# Patient Record
Sex: Female | Born: 1970 | Race: Black or African American | Hispanic: No | Smoking: Never smoker
Health system: Southern US, Community
[De-identification: ages and names within clinical notes are randomized; demographics above are authoritative.]

## PROBLEM LIST (undated history)

## (undated) DIAGNOSIS — N970 Female infertility associated with anovulation: Secondary | ICD-10-CM

## (undated) HISTORY — DX: Female infertility associated with anovulation: N97.0

---

## 2001-05-29 ENCOUNTER — Other Ambulatory Visit: Admission: RE | Admit: 2001-05-29 | Discharge: 2001-05-29 | Payer: Self-pay | Admitting: Obstetrics and Gynecology

## 2001-10-02 ENCOUNTER — Other Ambulatory Visit: Admission: RE | Admit: 2001-10-02 | Discharge: 2001-10-02 | Payer: Self-pay | Admitting: Gynecology

## 2002-05-22 HISTORY — PX: DIAGNOSTIC LAPAROSCOPY: SUR761

## 2002-12-11 ENCOUNTER — Other Ambulatory Visit: Admission: RE | Admit: 2002-12-11 | Discharge: 2002-12-11 | Payer: Self-pay | Admitting: Gynecology

## 2003-09-20 ENCOUNTER — Emergency Department (HOSPITAL_COMMUNITY): Admission: EM | Admit: 2003-09-20 | Discharge: 2003-09-20 | Payer: Self-pay | Admitting: Emergency Medicine

## 2003-12-03 ENCOUNTER — Other Ambulatory Visit: Admission: RE | Admit: 2003-12-03 | Discharge: 2003-12-03 | Payer: Self-pay | Admitting: Gynecology

## 2005-12-21 ENCOUNTER — Inpatient Hospital Stay (HOSPITAL_COMMUNITY): Admission: AD | Admit: 2005-12-21 | Discharge: 2005-12-21 | Payer: Self-pay | Admitting: Gynecology

## 2006-12-13 ENCOUNTER — Other Ambulatory Visit: Admission: RE | Admit: 2006-12-13 | Discharge: 2006-12-13 | Payer: Self-pay | Admitting: Gynecology

## 2007-08-06 ENCOUNTER — Ambulatory Visit (HOSPITAL_COMMUNITY): Admission: RE | Admit: 2007-08-06 | Discharge: 2007-08-06 | Payer: Self-pay | Admitting: Gynecology

## 2007-12-25 ENCOUNTER — Inpatient Hospital Stay (HOSPITAL_COMMUNITY): Admission: AD | Admit: 2007-12-25 | Discharge: 2007-12-25 | Payer: Self-pay | Admitting: Obstetrics and Gynecology

## 2007-12-28 ENCOUNTER — Observation Stay (HOSPITAL_COMMUNITY): Admission: AD | Admit: 2007-12-28 | Discharge: 2007-12-29 | Payer: Self-pay | Admitting: Obstetrics and Gynecology

## 2008-01-05 ENCOUNTER — Inpatient Hospital Stay (HOSPITAL_COMMUNITY): Admission: AD | Admit: 2008-01-05 | Discharge: 2008-01-06 | Payer: Self-pay | Admitting: Obstetrics and Gynecology

## 2008-01-07 ENCOUNTER — Encounter: Payer: Self-pay | Admitting: Obstetrics and Gynecology

## 2008-01-07 ENCOUNTER — Inpatient Hospital Stay (HOSPITAL_COMMUNITY): Admission: AD | Admit: 2008-01-07 | Discharge: 2008-01-07 | Payer: Self-pay | Admitting: Obstetrics and Gynecology

## 2008-01-11 ENCOUNTER — Inpatient Hospital Stay (HOSPITAL_COMMUNITY): Admission: AD | Admit: 2008-01-11 | Discharge: 2008-01-11 | Payer: Self-pay | Admitting: Obstetrics and Gynecology

## 2008-01-21 ENCOUNTER — Ambulatory Visit (HOSPITAL_COMMUNITY): Admission: RE | Admit: 2008-01-21 | Discharge: 2008-01-21 | Payer: Self-pay | Admitting: Obstetrics and Gynecology

## 2008-05-27 ENCOUNTER — Observation Stay (HOSPITAL_COMMUNITY): Admission: AD | Admit: 2008-05-27 | Discharge: 2008-05-27 | Payer: Self-pay | Admitting: Obstetrics and Gynecology

## 2008-06-16 ENCOUNTER — Inpatient Hospital Stay (HOSPITAL_COMMUNITY): Admission: RE | Admit: 2008-06-16 | Discharge: 2008-06-19 | Payer: Self-pay | Admitting: Obstetrics and Gynecology

## 2008-06-30 ENCOUNTER — Ambulatory Visit: Admission: RE | Admit: 2008-06-30 | Discharge: 2008-06-30 | Payer: Self-pay | Admitting: Obstetrics and Gynecology

## 2009-05-22 HISTORY — PX: DILATATION & CURRETTAGE/HYSTEROSCOPY WITH RESECTOCOPE: SHX5572

## 2009-06-04 IMAGING — US US OB DETAIL+14 WK
1 series · 14 of 28 positions shown · non-contrast
Comparison: none

OBSTETRICAL ULTRASOUND:
 This ultrasound was performed in The [HOSPITAL], and the [facility] report will be stored to [REDACTED] PACS.

[Series 1: us ob detail+14 wk · 14 of 120 slices shown]
[im 5/120]
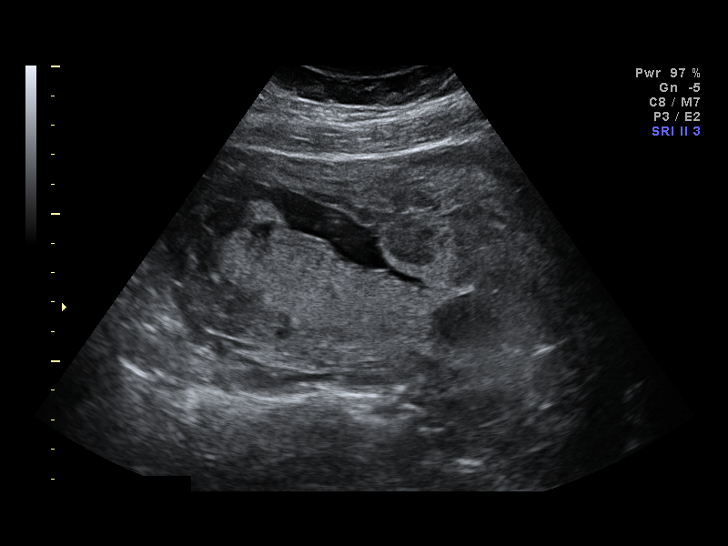
[im 14/120]
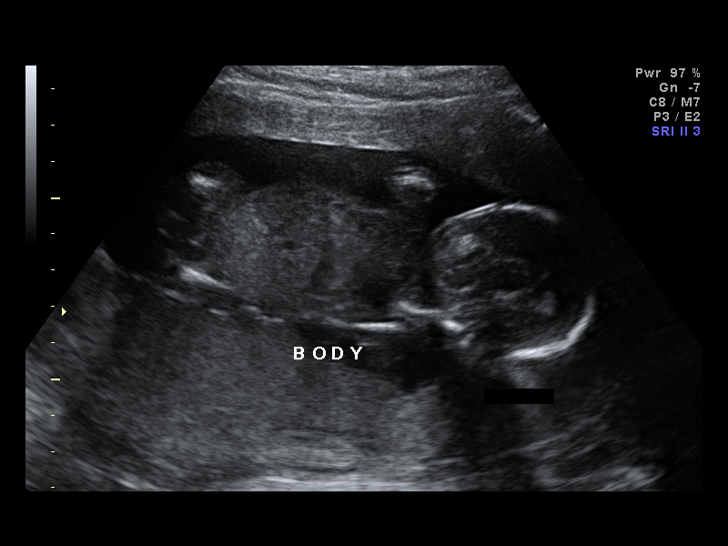
[im 23/120]
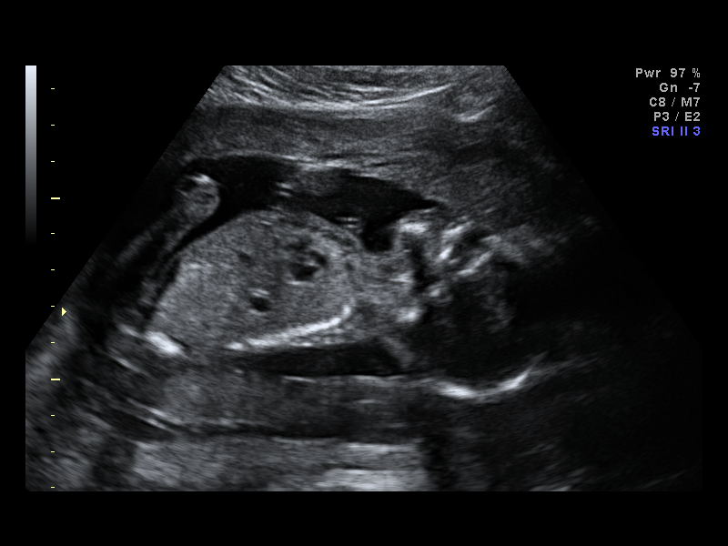
[im 31/120]
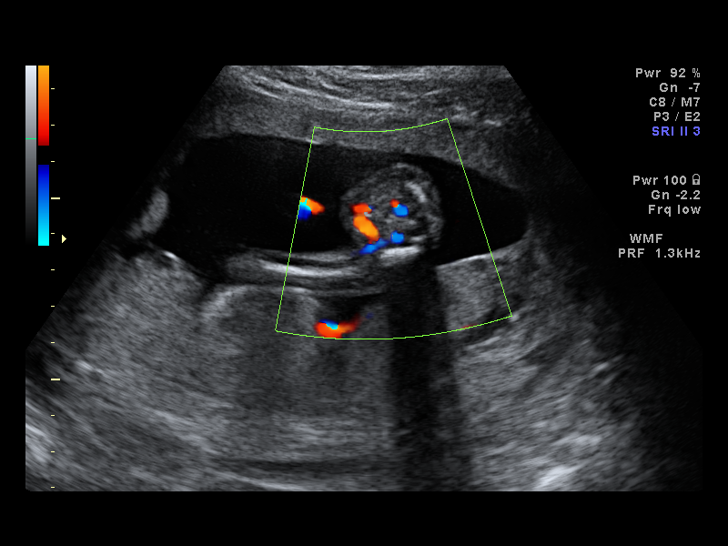
[im 40/120]
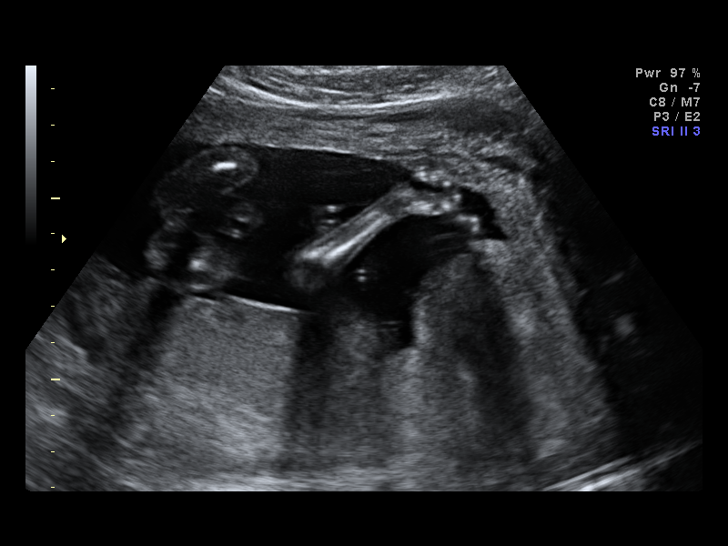
[im 49/120]
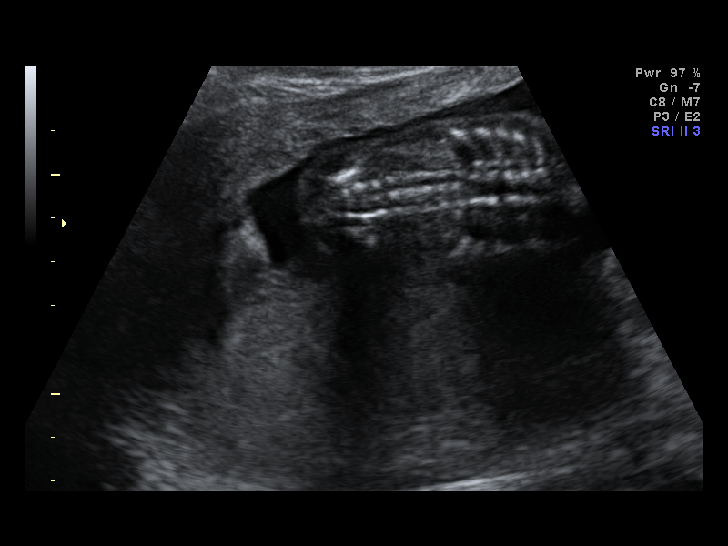
[im 58/120]
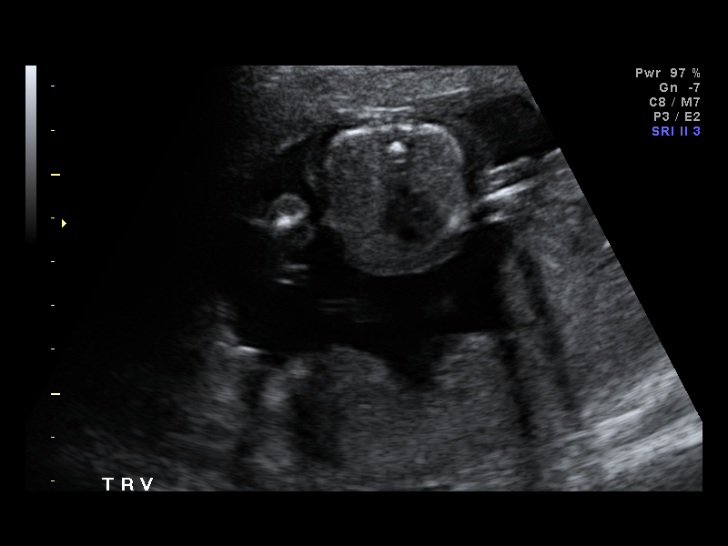
[im 67/120]
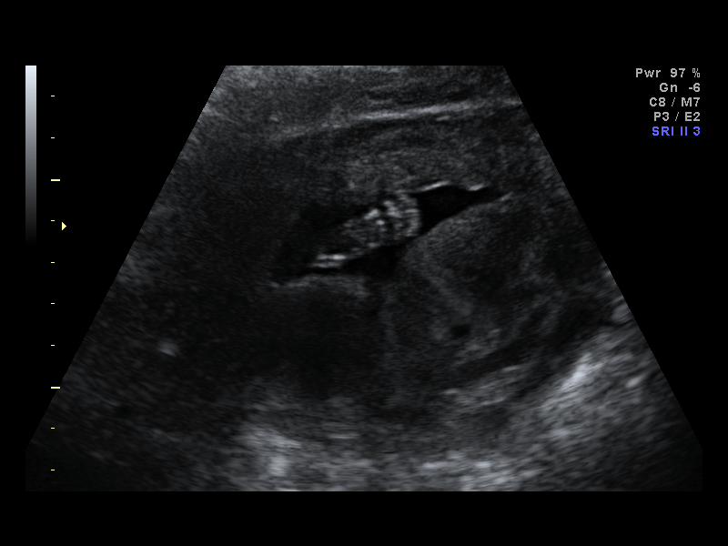
[im 75/120]
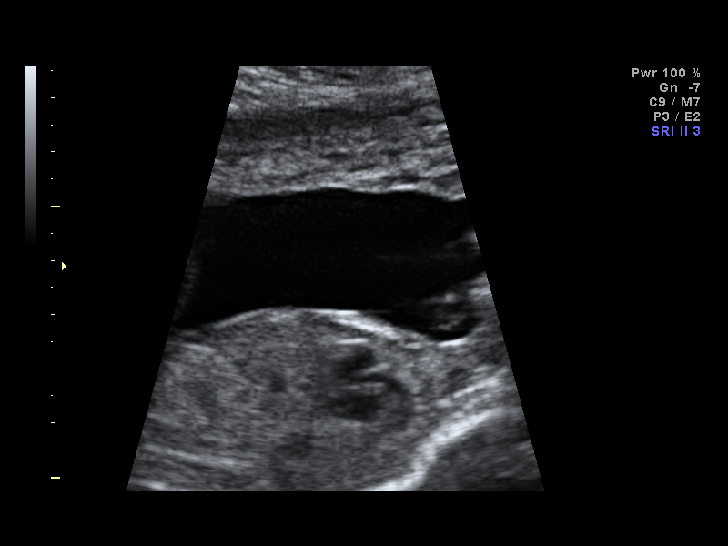
[im 84/120]
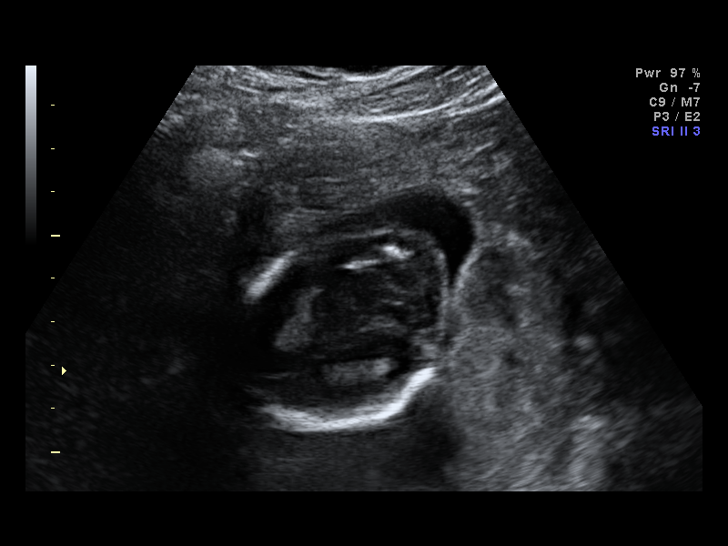
[im 93/120]
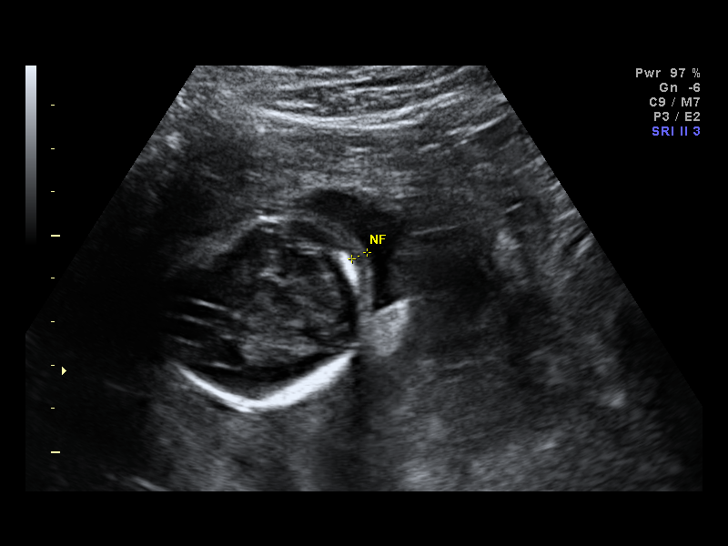
[im 102/120]
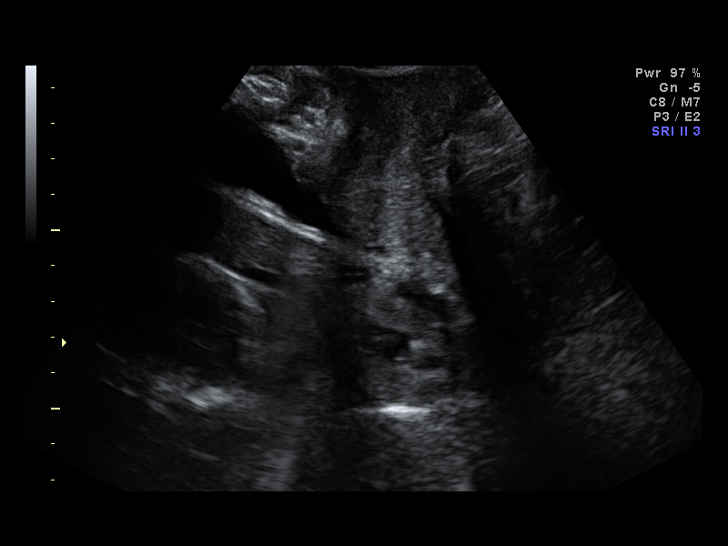
[im 111/120]
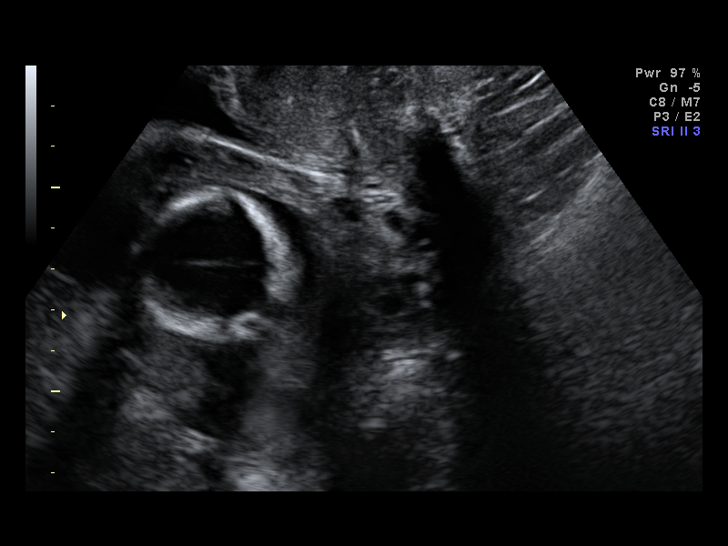
[im 120/120]
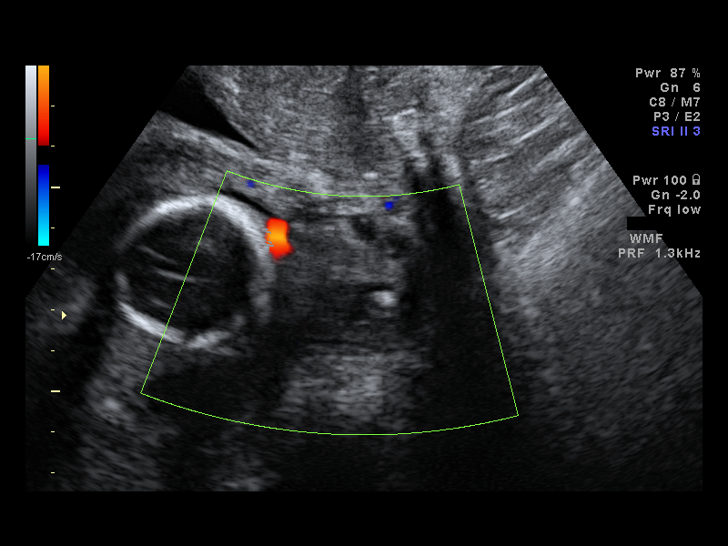

[14 of 28 positions shown; findings below may reference images not displayed]

IMPRESSION: [facility] has also been faxed to the ordering physician.

## 2010-01-22 ENCOUNTER — Ambulatory Visit (HOSPITAL_COMMUNITY): Admission: RE | Admit: 2010-01-22 | Discharge: 2010-01-22 | Payer: Self-pay | Admitting: Obstetrics and Gynecology

## 2010-08-04 LAB — CBC
HCT: 41.3 % (ref 36.0–46.0)
MCH: 31.5 pg (ref 26.0–34.0)
MCHC: 34.4 g/dL (ref 30.0–36.0)
RBC: 4.52 MIL/uL (ref 3.87–5.11)
RDW: 13.7 % (ref 11.5–15.5)

## 2010-09-05 LAB — CBC
HCT: 29.2 % — ABNORMAL LOW (ref 36.0–46.0)
Hemoglobin: 9.5 g/dL — ABNORMAL LOW (ref 12.0–15.0)
MCHC: 32.7 g/dL (ref 30.0–36.0)
MCV: 79.7 fL (ref 78.0–100.0)
MCV: 80.5 fL (ref 78.0–100.0)
Platelets: 173 10*3/uL (ref 150–400)
RBC: 3.66 MIL/uL — ABNORMAL LOW (ref 3.87–5.11)
RDW: 18.3 % — ABNORMAL HIGH (ref 11.5–15.5)
RDW: 18.8 % — ABNORMAL HIGH (ref 11.5–15.5)
WBC: 11 10*3/uL — ABNORMAL HIGH (ref 4.0–10.5)

## 2010-09-05 LAB — STREP B DNA PROBE

## 2010-09-05 LAB — RPR: RPR Ser Ql: NONREACTIVE

## 2010-10-04 NOTE — H&P (Signed)
NAMEBLEN, RANSOME NO.:  [id number]   MEDICAL RECORD NO.:  [id number]          PATIENT TYPE:  INP   LOCATION:  9173                          FACILITY:  [facility]   PHYSICIAN:  Osborn Coho, M.D.   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  She reports irregular uterine contractions and positive fetal  movement.  Pregnancy has been remarkable for;  1. Advanced maternal age with genetic screening declined.  2. History of infertility.  3. Cervical cerclage placed at 14 weeks secondary to cervical change      with it removed on May 27, 2008, at 36 weeks.  4. History of short cycles.  5. First trimester bleeding.  6. Family history of Down's syndrome.   PRENATAL LABS:  Blood type is A+, Rh antibody negative.  VDRL  nonreactive, rubella titer positive, hepatitis B surface antigen  negative, HIV is nonreactive.  Varicella titer was immune.  TSH was  normal.  Sickle cell test was negative.  GC and Chlamydia cultures were  negative in the first trimester.  Pap was normal.  PPD was negative.  Group B strep culture was negative at 36 weeks.  The patient declined  all genetic testing.  She had a normal Glucola.  Hemoglobin upon  entering the practice was 13.2, it was within normal limits at 28 weeks.   HISTORY OF PRESENT PREGNANCY:  The patient entered care at 11 weeks.  She had, had an ultrasound in July at 10 weeks showing an [facility] of June 19, 2008.  This was also congruent with a 5-week ultrasound.  She did  have a posterior placental hemorrhage on inferior margin and an anterior  subchorionic hemorrhage.  The patient declined all genetic testing.  At  14 weeks, she was seen in maternity admissions unit with bleeding.  Ultrasound then was normal.  At that time, cervical length was noted to  be normal on exam.  However, subsequent to that, she was seen in  maternity admissions again and had a cerclage placed for cervical  thinning.  Hemoglobin at that time was 10.  She had cervical Dopplers  done at maternal fetal medicine secondary to increased cervical  vascularity.  She was placed on 17P weekly.  She had another ultrasound  at 18 weeks showing cervical length of 4.56 and normal anatomy.  At that  time, she was placed on bedrest.  She had one episode of tachycardia at  19 weeks.  She had a TSH done that was normal and referral to her  cardiologist.  She was placed on Terazol at 22 weeks.  She had another  ultrasound at 23-6/7 weeks showing normal cervical length and normal  growth.  At 26 weeks, cervical length was 3.9 cm, normal fluid was  noted.  An estimated fetal weight was at the 75th percentile.  She had a  normal Glucola at 27 weeks.  Her cervical length at that time was 4.5 cm  and she wore a Holter monitor for cardiology evaluation.  No significant  findings were noted.  At 30 weeks, she had an episode of elevated blood  pressure.  She had a normal cervical length at that time and normal  growth.  She had no further issues with her blood pressure after that.  At 32 weeks, her cervical length was 3.46 and growth was at the 57th  percentile.  She had negative group B strep noted at 36 weeks.  She  received 17P at 34 weeks.  Cervical cerclage removed on May 27, 2008, without difficulty.  Cervix at that time was closed, but this week  her cervix was noted to be 2 cm.   OBSTETRICAL HISTORY:  In 1992, she had a termination of pregnancy in the  first trimester.  In 2007, she had a first trimester loss which was an  SAB versus an ectopic.  She was treated with methotrexate without  complication.   MEDICAL HISTORY:  She was on oral contraceptives in the past.  She was  followed by Dr. Elesa Hacker  prior to this pregnancy. Usual childhood  illnesses.  She has had one history of a UTI.   SURGICAL HISTORY:  She had a TAB in 1992 and a diagnostic laparoscopy in  2004.   ALLERGIES:  NO KNOWN MEDICATION ALLERGIES.   FAMILY HISTORY:  Her father has diabetes.   GENETIC HISTORY:  Remarkable for the patient being age 36 and the father  of baby being 42, and the patient having a niece with Down's syndrome.   SOCIAL HISTORY:  The patient is married to the father of baby.  He is  involved and supportive.  His name is Shelvy Heckert.  The patient is  [place] of the [place] faith.  She is an OB/GYN physician.  Her husband is graduate educated, he is a Runner, broadcasting/film/video.  She has been  followed by the Physician Service at [facility].  She denies  any alcohol, drug or tobacco use during this pregnancy.   PHYSICAL EXAMINATION:  VITAL SIGNS:  Stable.  The patient is afebrile.  HEENT:  Within normal limits.  LUNGS:  Breath sounds are clear.  HEART:  Regular rate and rhythm without murmur.  BREASTS:  Soft and nontender.  ABDOMEN:  Fundal height is approximately 39 cm.  Estimated fetal weight  7 to 7-1/2 pounds.  Uterine contractions are very occasionally mild.  Fetal heart rate is reactive.  Cervical exam is 2, 50%, vertex from the  office exam this past week.  EXTREMITIES:  Deep tendon reflexes are 2+ without clonus.  There is a  trace edema noted.   IMPRESSION:  1. Intrauterine pregnancy at 39-3/7 weeks.  2. Previous cervical cerclage, recently moved  3. Group B strep negative.   PLAN:  1. Admit to birthing suite and consult Dr. Su Hilt as attending      physician.  2. Routine physician orders.  3. Dr. Su Hilt plans initiation of Pitocin for labor induction.  4. Pain med p.r.n.      Renaldo Reel Emilee Hero, C.N.M.      Osborn Coho, M.D.  Electronically Signed    VLL/MEDQ  D:  06/16/2008  T:  06/16/2008  Job:  [id number]

## 2010-10-04 NOTE — Op Note (Signed)
NAMEJOANNE, SALAH NO.:  [id number]   MEDICAL RECORD NO.:  [id number]          PATIENT TYPE:  OBV   LOCATION:  9303                          FACILITY:  [facility]   PHYSICIAN:  Osborn Coho, M.D.   DATE OF BIRTH:  [birth year]   DATE OF PROCEDURE:  12/28/2007  DATE OF DISCHARGE:  12/29/2007                               OPERATIVE REPORT   PREOPERATIVE DIAGNOSES:  1. Vaginal bleeding.  2. Incompetent cervix.  3. 15 weeks' pregnant.   POSTOPERATIVE DIAGNOSES:  1. Vaginal bleeding.  2. Incompetent cervix.  3. 15 weeks' pregnant.   PROCEDURE:  Cerclage.   ATTENDING:  Osborn Coho, M.D.   ASSISTANT:  French Ana, C.N.M.   ANESTHESIA:  Spinal.   FINDINGS:  Cervix dilated approximately 1 to 2 cm and primary source of  bleeding from an artery at 12 o'clock on the anterior lip of the cervix.  Ultrasound in PACU immediately postop showed a positive fetal heart rate  at 152 and subjectively normal amniotic fluid.   FLUIDS:  1800 mL.   URINE OUTPUT:  Quite sufficient via straight cath prior to procedure.   ESTIMATED BLOOD LOSS:  Mostly occurred in the MAU, 1000 mL.   COMPLICATIONS:  None.   INDICATION:  A 15 weeks' pregnant with profuse vaginal bleeding that was  unable to be controlled in the MAU.  However, a clamp was placed on the  cervix where most of the bleeding was occurring in the MAU, and the  patient was taken to the OR room 3 for procedure.   PROCEDURE:  The patient was taken to the operating room after risks,  benefits, and alternatives discussed with the patient.  The patient  verbalized understanding and consent signed and witnessed.  The patient  was given a spinal per anesthesia and prepped and draped in the normal  sterile fashion in the dorsal lithotomy position.  A weighted speculum  was placed in the patient's vagina and a Deaver placed in the anterior  vaginal wall for retraction.  The cervix was prepped thoroughly again  with  Betadine and the cerclage placed with #1 Mersilene and the suture  tied at the 12 o'clock position.  This was done while the clamp on the  primary source of bleeding was left in place.  The oozing that had been  noted generally from the cervix at this point had ceased.  The clamp was  removed and again profuse vaginal bleeding from that vessel was noted.  The decision was made to place a stitch at this location of #1 Prolene  to be removed at the time that the cerclage was removed.  Good  hemostasis at this point was noted.  The vagina was copiously irrigated  and a clindamycin douche was administered.  Hemostasis persisted.  All  instruments were removed.  The patient tolerated the procedure well.  Sponge, lap, and needle count was correct, and the patient was returned  to the recovery room in good condition.  While in the recovery room as  mentioned earlier, an ultrasound by Radiology was performed and noted a  fetal heart rate of 152 and subjectively normal fluid.      Osborn Coho, M.D.  Electronically Signed     AR/MEDQ  D:  12/29/2007  T:  12/29/2007  Job:  [id number]

## 2011-02-17 LAB — CBC
HCT: 36.4
Hemoglobin: 8.4 — ABNORMAL LOW
MCHC: 33.6
MCHC: 33.8
MCV: 94.3
Platelets: 159
Platelets: 212
RBC: 3.86 — ABNORMAL LOW
RDW: 14.3
RDW: 14.4

## 2011-02-17 LAB — CROSSMATCH: ABO/RH(D): A POS

## 2012-04-01 ENCOUNTER — Ambulatory Visit (INDEPENDENT_AMBULATORY_CARE_PROVIDER_SITE_OTHER): Payer: PRIVATE HEALTH INSURANCE | Admitting: Obstetrics and Gynecology

## 2012-04-01 ENCOUNTER — Encounter: Payer: Self-pay | Admitting: Obstetrics and Gynecology

## 2012-04-01 VITALS — BP 104/80 | Ht 67.0 in | Wt 226.0 lb

## 2012-04-01 DIAGNOSIS — Z139 Encounter for screening, unspecified: Secondary | ICD-10-CM

## 2012-04-01 DIAGNOSIS — N920 Excessive and frequent menstruation with regular cycle: Secondary | ICD-10-CM

## 2012-04-01 DIAGNOSIS — Z01419 Encounter for gynecological examination (general) (routine) without abnormal findings: Secondary | ICD-10-CM

## 2012-04-01 DIAGNOSIS — Z124 Encounter for screening for malignant neoplasm of cervix: Secondary | ICD-10-CM

## 2012-04-01 DIAGNOSIS — N898 Other specified noninflammatory disorders of vagina: Secondary | ICD-10-CM

## 2012-04-01 LAB — CBC
MCV: 87.9 fL (ref 78.0–100.0)
Platelets: 254 10*3/uL (ref 150–400)
RBC: 4.46 MIL/uL (ref 3.87–5.11)
RDW: 14 % (ref 11.5–15.5)
WBC: 10.5 10*3/uL (ref 4.0–10.5)

## 2012-04-01 NOTE — Progress Notes (Addendum)
Contraception none Last pap ? Last Mammo 09/2011 to F/U in Dec Last Colonoscopy None Last Dexa Scan None Primary MD Dorothyann Peng Abuse at Home None  C/o internal vag itch and frequent menses  Filed Vitals:   04/01/12 1711  BP: 104/80   ROS: noncontributory  Physical Examination: General appearance - alert, well appearing, and in no distress Neck - supple, no significant adenopathy Chest - clear to auscultation, no wheezes, rales or rhonchi, symmetric air entry Heart - normal rate and regular rhythm Abdomen - soft, nontender, nondistended, no masses or organomegaly Breasts - breasts appear normal, no suspicious masses, no skin or nipple changes or axillary nodes Pelvic - normal external genitalia, vulva, vagina, uterus and adnexa, cervix has ?nabothian cyst on ant lip palpable approx 1/2cm Back exam - no CVAT Extremities - no edema, redness or tenderness in the calves or thighs  A/P Pap today Std testing with consent Check cbc and vit d Wet prep - neg

## 2012-04-02 LAB — VITAMIN D 25 HYDROXY (VIT D DEFICIENCY, FRACTURES): Vit D, 25-Hydroxy: 17 ng/mL — ABNORMAL LOW (ref 30–89)

## 2012-04-03 ENCOUNTER — Other Ambulatory Visit: Payer: Self-pay | Admitting: Obstetrics and Gynecology

## 2012-04-03 ENCOUNTER — Telehealth: Payer: Self-pay

## 2012-04-03 ENCOUNTER — Other Ambulatory Visit: Payer: Self-pay

## 2012-04-03 DIAGNOSIS — E559 Vitamin D deficiency, unspecified: Secondary | ICD-10-CM

## 2012-04-03 LAB — PAP IG, CT-NG, RFX HPV ASCU: Chlamydia Probe Amp: NEGATIVE

## 2012-04-03 MED ORDER — VITAMIN D3 1.25 MG (50000 UT) PO CAPS: 1.0000 | ORAL_CAPSULE | ORAL | Status: DC

## 2012-04-03 NOTE — Telephone Encounter (Signed)
Per protocol, Vit D softgels called to [facility] and [place]. Vit D softgels, 50,000 units 1 po 2x weekly x 8 weeks # 16 0 RF. Future order and recall entered. [name]

## 2012-04-03 NOTE — Telephone Encounter (Signed)
Note to close encounter. Martha [name], Jacqueline A  

## 2012-06-04 ENCOUNTER — Ambulatory Visit: Payer: PRIVATE HEALTH INSURANCE | Admitting: Obstetrics and Gynecology

## 2012-06-04 ENCOUNTER — Encounter: Payer: Self-pay | Admitting: Obstetrics and Gynecology

## 2012-06-04 DIAGNOSIS — B029 Zoster without complications: Secondary | ICD-10-CM

## 2012-06-04 MED ORDER — VALACYCLOVIR HCL 1 G PO TABS: ORAL_TABLET | ORAL | Status: DC

## 2012-06-04 NOTE — Progress Notes (Signed)
41 YO complains of a one day history of painful rash on left buttock that is increasing in intensity.  Denies any recall of any bite to the area and admits to a history of chickenpox.  O: Buttock:  left natal cleft with a cluster of vesicular vs pustular lesions on an erythematous base, no drainage  A: ? Shingles  P: HSZ culture pending      Valtrex 1 gm tid x 7 days prescribed    Savon Cobbs, PA-C

## 2012-08-01 ENCOUNTER — Other Ambulatory Visit: Payer: Self-pay | Admitting: Obstetrics and Gynecology

## 2012-08-01 MED ORDER — TRANEXAMIC ACID 650 MG PO TABS: 1300.0000 mg | ORAL_TABLET | Freq: Three times a day (TID) | ORAL | Status: DC | PRN

## 2014-03-23 ENCOUNTER — Encounter: Payer: Self-pay | Admitting: Obstetrics and Gynecology

## 2017-02-27 DIAGNOSIS — R635 Abnormal weight gain: Secondary | ICD-10-CM | POA: Diagnosis not present

## 2017-02-27 DIAGNOSIS — R948 Abnormal results of function studies of other organs and systems: Secondary | ICD-10-CM | POA: Diagnosis not present

## 2017-02-27 DIAGNOSIS — E669 Obesity, unspecified: Secondary | ICD-10-CM | POA: Diagnosis not present

## 2017-02-27 DIAGNOSIS — Z6834 Body mass index (BMI) 34.0-34.9, adult: Secondary | ICD-10-CM | POA: Diagnosis not present

## 2017-02-27 DIAGNOSIS — E786 Lipoprotein deficiency: Secondary | ICD-10-CM | POA: Diagnosis not present

## 2017-02-27 DIAGNOSIS — R7303 Prediabetes: Secondary | ICD-10-CM | POA: Diagnosis not present

## 2017-12-11 ENCOUNTER — Other Ambulatory Visit: Payer: Self-pay | Admitting: Obstetrics and Gynecology

## 2017-12-11 DIAGNOSIS — N643 Galactorrhea not associated with childbirth: Secondary | ICD-10-CM

## 2018-01-18 ENCOUNTER — Other Ambulatory Visit: Payer: Self-pay

## 2018-03-18 ENCOUNTER — Ambulatory Visit (INDEPENDENT_AMBULATORY_CARE_PROVIDER_SITE_OTHER): Payer: Managed Care, Other (non HMO) | Admitting: Nurse Practitioner

## 2018-03-18 ENCOUNTER — Encounter: Payer: Self-pay | Admitting: Nurse Practitioner

## 2018-03-18 VITALS — BP 132/88 | HR 73 | Temp 98.1°F | Ht 67.0 in | Wt 216.4 lb

## 2018-03-18 DIAGNOSIS — E6609 Other obesity due to excess calories: Secondary | ICD-10-CM | POA: Diagnosis not present

## 2018-03-18 DIAGNOSIS — Z6833 Body mass index (BMI) 33.0-33.9, adult: Secondary | ICD-10-CM | POA: Diagnosis not present

## 2018-03-18 MED ORDER — PHENTERMINE HCL 37.5 MG PO CAPS: 37.5000 mg | ORAL_CAPSULE | ORAL | 1 refills | Status: DC

## 2018-03-18 NOTE — Patient Instructions (Signed)
Obesity, Adult Obesity is having too much body fat. If you have a BMI of 30 or more, you are obese. BMI is a number that explains how much body fat you have. Obesity is often caused by taking in (consuming) more calories than your body uses. Obesity can cause serious health problems. Changing your lifestyle can help to treat obesity. Follow these instructions at home: Eating and drinking   Follow advice from your doctor about what to eat and drink. Your doctor may tell you to: ? Cut down on (limit) fast foods, sweets, and processed snack foods. ? Choose low-fat options. For example, choose low-fat milk instead of whole milk. ? Eat 5 or more servings of fruits or vegetables every day. ? Eat at home more often. This gives you more control over what you eat. ? Choose healthy foods when you eat out. ? Learn what a healthy portion size is. A portion size is the amount of a certain food that is healthy for you to eat at one time. This is different for each person. ? Keep low-fat snacks available. ? Avoid sugary drinks. These include soda, fruit juice, iced tea that is sweetened with sugar, and flavored milk. ? Eat a healthy breakfast.  Drink enough water to keep your pee (urine) clear or pale yellow.  Do not go without eating for long periods of time (do not fast).  Do not go on popular or trendy diets (fad diets). Physical Activity  Exercise often, as told by your doctor. Ask your doctor: ? What types of exercise are safe for you. ? How often you should exercise.  Warm up and stretch before being active.  Do slow stretching after being active (cool down).  Rest between times of being active. Lifestyle  Limit how much time you spend in front of your TV, computer, or video game system (be less sedentary).  Find ways to reward yourself that do not involve food.  Limit alcohol intake to no more than 1 drink a day for nonpregnant women and 2 drinks a day for men. One drink equals 12 oz  of beer, 5 oz of wine, or 1 oz of hard liquor. General instructions  Keep a weight loss journal. This can help you keep track of: ? The food that you eat. ? The exercise that you do.  Take over-the-counter and prescription medicines only as told by your doctor.  Take vitamins and supplements only as told by your doctor.  Think about joining a support group. Your doctor may be able to help with this.  Keep all follow-up visits as told by your doctor. This is important. Contact a doctor if:  You cannot meet your weight loss goal after you have changed your diet and lifestyle for 6 weeks. This information is not intended to replace advice given to you by your health care provider. Make sure you discuss any questions you have with your health care provider. Document Released: 07/31/2011 Document Revised: 10/14/2015 Document Reviewed: 02/24/2015 Elsevier Interactive Patient Education  2018 Elsevier Inc.  

## 2018-03-18 NOTE — Progress Notes (Signed)
  Subjective:     Patient ID: Martha [name] , female    DOB: 09-26-1970 , 47 y.o.   MRN: [id number]   Chief Complaint  Patient presents with  . Obesity    pt wants a refill of phentermine    HPI  Obesity - She had been going to [facility] for 1 year, she recently started Phentermine 15 mg was 230 lbs. Diet change, exercising 2-4 times per week Cardio.      Past Medical History:  Diagnosis Date  . Infertility associated with anovulation       Current Outpatient Medications:  .  Cholecalciferol (VITAMIN D3) 50000 UNITS CAPS, Take 1 tablet by mouth 2 (two) times a week. For 8 wks then recheck bloodwork., Disp: 20 capsule, Rfl: 0 .  levonorgestrel (MIRENA) 20 MCG/24HR IUD, 1 each by Intrauterine route once., Disp: , Rfl:  .  phentermine 15 MG capsule, Take 15 mg by mouth every morning., Disp: , Rfl:  .  valACYclovir (VALTREX) 1000 MG tablet, 1 po tid x 7 days, Disp: 21 tablet, Rfl: 0   No Known Allergies   Review of Systems  Constitutional: Negative.   HENT: Negative.   Respiratory: Negative.   Cardiovascular: Negative.   Gastrointestinal: Negative.   Musculoskeletal: Negative.      Today's Vitals   03/18/18 1112  BP: 132/88  Pulse: 73  Temp: 98.1 F (36.7 C)  TempSrc: Oral  SpO2: 98%  Weight: 216 lb 6.4 oz (98.2 kg)  Height: 5\' 7"  (1.702 m)  PainSc: 0-No pain   Body mass index is 33.89 kg/m.   Objective:  Physical Exam  Constitutional: She is oriented to person, place, and time. She appears well-developed and well-nourished.  Eyes: Pupils are equal, round, and reactive to light.  Pulmonary/Chest: Effort normal and breath sounds normal.  Neurological: She is alert and oriented to person, place, and time.  Skin: Skin is warm and dry.        Assessment And Plan:     1. Class 1 obesity due to excess calories without serious comorbidity with body mass index (BMI) of 33.0 to 33.9 in adult  Chronic  Congratulated on 14 lb weight  loss  Increased phentermine to 37.5 mg daily.   Return to office in 8 weeks - phentermine 37.5 MG capsule; Take 1 capsule (37.5 mg total) by mouth every morning.  Dispense: 30 capsule; Refill: [place], FNP

## 2018-03-22 ENCOUNTER — Ambulatory Visit: Payer: Self-pay | Admitting: Internal Medicine

## 2018-04-24 ENCOUNTER — Ambulatory Visit
Admission: RE | Admit: 2018-04-24 | Discharge: 2018-04-24 | Disposition: A | Discharge status: Home / Self Care | Payer: Managed Care, Other (non HMO) | Source: Ambulatory Visit | Attending: Obstetrics and Gynecology | Admitting: Obstetrics and Gynecology

## 2018-04-24 DIAGNOSIS — N643 Galactorrhea not associated with childbirth: Secondary | ICD-10-CM

## 2018-04-24 MED ORDER — GADOBUTROL 1 MMOL/ML IV SOLN
10.0000 mL | Freq: Once | INTRAVENOUS | Status: AC | PRN
  Administered 2018-04-24: 10 mL via INTRAVENOUS

## 2018-04-25 ENCOUNTER — Other Ambulatory Visit (HOSPITAL_COMMUNITY): Payer: Self-pay | Admitting: Obstetrics and Gynecology

## 2018-04-25 DIAGNOSIS — R928 Other abnormal and inconclusive findings on diagnostic imaging of breast: Secondary | ICD-10-CM

## 2018-05-13 ENCOUNTER — Ambulatory Visit (INDEPENDENT_AMBULATORY_CARE_PROVIDER_SITE_OTHER): Payer: Managed Care, Other (non HMO) | Admitting: Internal Medicine

## 2018-05-13 ENCOUNTER — Encounter: Payer: Self-pay | Admitting: Internal Medicine

## 2018-05-13 VITALS — BP 124/82 | HR 76 | Temp 97.6°F | Ht 67.5 in | Wt 215.8 lb

## 2018-05-13 DIAGNOSIS — E6609 Other obesity due to excess calories: Secondary | ICD-10-CM | POA: Diagnosis not present

## 2018-05-13 DIAGNOSIS — Z6833 Body mass index (BMI) 33.0-33.9, adult: Secondary | ICD-10-CM | POA: Diagnosis not present

## 2018-05-13 MED ORDER — PHENTERMINE HCL 37.5 MG PO CAPS: 37.5000 mg | ORAL_CAPSULE | ORAL | 1 refills | Status: DC

## 2018-05-13 NOTE — Patient Instructions (Signed)

## 2018-05-18 ENCOUNTER — Encounter: Payer: Self-pay | Admitting: Internal Medicine

## 2018-05-18 DIAGNOSIS — Z6833 Body mass index (BMI) 33.0-33.9, adult: Principal | ICD-10-CM

## 2018-05-18 DIAGNOSIS — E6609 Other obesity due to excess calories: Secondary | ICD-10-CM | POA: Insufficient documentation

## 2018-05-18 NOTE — Progress Notes (Signed)
  Subjective:     Patient ID: Martha [name] , female    DOB: Apr 09, 1971 , 48 y.o.   MRN: [id number]   Chief Complaint  Patient presents with  . Obesity    HPI  She is here today for f/u obesity.  She was previously seen by [facility] weight loss center; however, no longer wants to go there. She reports getting phentermine while there, and would like to continue on the medication. She does not wish to pursue weight loss surgery.     Past Medical History:  Diagnosis Date  . Infertility associated with anovulation      Family History  Problem Relation Age of Onset  . Hypertension Father   . Diabetes Father   . Breast cancer Mother 39  . Ovarian cancer Maternal Grandmother      Current Outpatient Medications:  .  levonorgestrel (MIRENA) 20 MCG/24HR IUD, 1 each by Intrauterine route once., Disp: , Rfl:  .  phentermine 37.5 MG capsule, Take 1 capsule (37.5 mg total) by mouth every morning., Disp: 30 capsule, Rfl: 1 .  valACYclovir (VALTREX) 1000 MG tablet, 1 po tid x 7 days, Disp: 21 tablet, Rfl: 0   No Known Allergies   Review of Systems  Constitutional: Negative.   Respiratory: Negative.   Cardiovascular: Negative.   Gastrointestinal: Negative.   Neurological: Negative.   Psychiatric/Behavioral: Negative.      Today's Vitals   05/13/18 1134  BP: 124/82  Pulse: 76  Temp: 97.6 F (36.4 C)  TempSrc: Oral  Weight: 215 lb 12.8 oz (97.9 kg)  Height: 5' 7.5" (1.715 m)   Body mass index is 33.3 kg/m.   Objective:  Physical Exam Vitals signs and nursing note reviewed.  Constitutional:      Appearance: Normal appearance. She is obese.  Cardiovascular:     Rate and Rhythm: Normal rate and regular rhythm.     Heart sounds: Normal heart sounds.  Pulmonary:     Effort: Pulmonary effort is normal.     Breath sounds: Normal breath sounds.  Skin:    General: Skin is warm.  Neurological:     General: No focal deficit present.     Mental Status: She is alert.   Psychiatric:        Mood and Affect: Mood normal.         Assessment And Plan:     1. Class 1 obesity due to excess calories without serious comorbidity with body mass index (BMI) of 33.0 to 33.9 in adult  She was given a refill of phentermine. However, after the first of the year, I would like to try to get Qsymia approved. This can be used for a longer period of time than phentermine alone. Additionally, I think it is more effective. She is in agreement with this plan. I plan to start her on 7.5mg  once daily and slowly titrate her up to 15mg  if tolerated. She will rto in 8 weeks for re-evaluation. She is also encouraged to incorporate 131min of exercise per week.   - phentermine 37.5 MG capsule; Take 1 capsule (37.5 mg total) by mouth every morning.  Dispense: 30 capsule; Refill: 1         Maximino Greenland, MD

## 2018-06-03 ENCOUNTER — Ambulatory Visit
Admission: RE | Admit: 2018-06-03 | Discharge: 2018-06-03 | Disposition: A | Discharge status: Home / Self Care | Payer: Managed Care, Other (non HMO) | Source: Ambulatory Visit | Attending: Obstetrics and Gynecology | Admitting: Obstetrics and Gynecology

## 2018-06-03 ENCOUNTER — Ambulatory Visit
Admission: RE | Admit: 2018-06-03 | Discharge: 2018-06-03 | Disposition: A | Discharge status: Home / Self Care | Payer: BLUE CROSS/BLUE SHIELD | Source: Ambulatory Visit | Attending: Obstetrics and Gynecology | Admitting: Obstetrics and Gynecology

## 2018-06-03 DIAGNOSIS — R928 Other abnormal and inconclusive findings on diagnostic imaging of breast: Secondary | ICD-10-CM

## 2018-06-03 DIAGNOSIS — N6012 Diffuse cystic mastopathy of left breast: Secondary | ICD-10-CM | POA: Diagnosis not present

## 2018-06-03 MED ORDER — GADOBUTROL 1 MMOL/ML IV SOLN
10.0000 mL | Freq: Once | INTRAVENOUS | Status: AC | PRN
  Administered 2018-06-03: 10 mL via INTRAVENOUS

## 2018-06-06 ENCOUNTER — Ambulatory Visit: Payer: Self-pay | Admitting: General Surgery

## 2018-06-06 DIAGNOSIS — N6452 Nipple discharge: Secondary | ICD-10-CM | POA: Diagnosis not present

## 2018-06-11 ENCOUNTER — Other Ambulatory Visit: Payer: Self-pay | Admitting: General Surgery

## 2018-06-11 DIAGNOSIS — N6452 Nipple discharge: Secondary | ICD-10-CM

## 2018-06-24 ENCOUNTER — Other Ambulatory Visit: Payer: Self-pay

## 2018-06-24 ENCOUNTER — Encounter (HOSPITAL_BASED_OUTPATIENT_CLINIC_OR_DEPARTMENT_OTHER): Payer: Self-pay | Admitting: *Deleted

## 2018-06-27 NOTE — Progress Notes (Signed)
Ensure pre surgery drink given with instructions to complete by 0415 dos, surgical soap given with instructions, pt verbalized understanding. 

## 2018-07-01 ENCOUNTER — Ambulatory Visit
Admission: RE | Admit: 2018-07-01 | Discharge: 2018-07-01 | Disposition: A | Discharge status: Home / Self Care | Payer: BLUE CROSS/BLUE SHIELD | Source: Ambulatory Visit | Attending: General Surgery | Admitting: General Surgery

## 2018-07-01 ENCOUNTER — Other Ambulatory Visit: Payer: Self-pay | Admitting: General Surgery

## 2018-07-01 DIAGNOSIS — Z113 Encounter for screening for infections with a predominantly sexual mode of transmission: Secondary | ICD-10-CM | POA: Diagnosis not present

## 2018-07-01 DIAGNOSIS — Z6833 Body mass index (BMI) 33.0-33.9, adult: Secondary | ICD-10-CM | POA: Diagnosis not present

## 2018-07-01 DIAGNOSIS — R928 Other abnormal and inconclusive findings on diagnostic imaging of breast: Secondary | ICD-10-CM | POA: Diagnosis not present

## 2018-07-01 DIAGNOSIS — Z124 Encounter for screening for malignant neoplasm of cervix: Secondary | ICD-10-CM | POA: Diagnosis not present

## 2018-07-01 DIAGNOSIS — Z01419 Encounter for gynecological examination (general) (routine) without abnormal findings: Secondary | ICD-10-CM | POA: Diagnosis not present

## 2018-07-01 DIAGNOSIS — R399 Unspecified symptoms and signs involving the genitourinary system: Secondary | ICD-10-CM | POA: Diagnosis not present

## 2018-07-01 DIAGNOSIS — N6452 Nipple discharge: Secondary | ICD-10-CM

## 2018-07-01 NOTE — H&P (Signed)
History of Present Illness Martha Kitchen T. Adalind Weitz MD; 06/06/2018 11:34 AM) The patient is a 48 year old female who presents with a complaint of Breast problems. Martha [name] is a 48 year old female referred by Martha [name] and [name] for recent abnormal imaging and persistent left nipple discharge. She states that about 6 months ago she developed spontaneous left nipple discharge. This has been clear and coming from a single point in the central left nipple. She has a significant family history of breast cancer in her mother at age 46. No personal history. Initially she had mammogram and ultrasound at [name] in July 2019 that was negative. Discharge continued and therefore this was evaluated with bilateral breast MRI on April 24, 2018. This revealed suspicious appearing ductal enhancement in the retroareolar left breast upper outer quadrant as well as a 1.7 cm area of suspicious non-mass enhancement lower outer left breast and a third suspicious enhancing mass in the lower outer right breast. Several benign appearing masses in the left breast as well. Patient then presented for MRI guided biopsy. At that time the area in the right breast had resolved. She underwent MRI guided core biopsy of the lower outer left breast. 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 The retro-areolar area upper-outer left breast was so close to the skin that it was not biopsied but a marking clip was placed. Excision has been recommended. She has continued nipple discharge but no other symptoms such as mass or pain or skin changes.   Diagnostic Studies History Martha [name], Martha [name]; 06/06/2018 11:08 AM) Mammogram  within last year  Allergies Martha [name], Martha [name]; 06/06/2018 11:10 AM) No Known Drug Allergies [06/06/2018]: Allergies Reconciled   Medication History Martha [name], Martha [name]; 06/06/2018 11:10 AM) Phentermine HCl (37.5MG  Capsule, Oral) Active. Mirena (52 MG)  (20MCG/24HR IUD, Intrauterine) Active. Medications Reconciled  Family History Martha [name], Martha [name]; 06/06/2018 11:08 AM) Breast Cancer  Mother. Colon Polyps  Father. Diabetes Mellitus  Father. Hypertension  Father.  Pregnancy / Birth History Martha [name], Martha [name]; 06/06/2018 11:08 AM) Age at menarche  22 years. Length (months) of breastfeeding  7-12 Maternal age  70-20  Other Problems Martha [name], Martha [name]; 06/06/2018 11:08 AM) No pertinent past medical history     Review of Systems [name] Martha [name]; 06/06/2018 11:08 AM) Breast Present- Nipple Discharge. Not Present- Breast Mass, Breast Pain and Skin Changes. Psychiatric Not Present- Anxiety, Bipolar, Change in Sleep Pattern, Depression, Fearful and Frequent crying.  Vitals Martha [name]; 06/06/2018 11:10 AM) 06/06/2018 11:09 AM Weight: 220.4 lb Height: 68in Body Surface Area: 2.13 m Body Mass Index: 33.51 kg/m  Temp.: 98.78F  Pulse: 142 (Regular)  BP: 130/82 (Sitting, Left Arm, Standard)       Physical Exam Martha Kitchen T. Jaylena Holloway MD; 06/06/2018 11:36 AM) The physical exam findings are as follows: Note:General: Healthy-appearing well-developed African American female no distress Lungs: Clear his respirations Lymph nodes: No cervical, supraclavicular or axillary nodes palpable Breasts: Recent biopsies left breast. Appropriate tenderness. Biopsy site is within the left areola about halfway between the nipple and the edge of the areola at the 2 to 2:30 position. With pressure there is a tiny bit of clear discharge central nipple. No crusting. No palpable masses.    Assessment & Plan Martha Kitchen T. Dniyah Grant MD; 06/06/2018 11:48 AM) DISCHARGE FROM LEFT NIPPLE (N64.52) Impression: 48 year old female with family history of breast cancer in her mother with persistent single duct clear left nipple discharge and MRI showing a suspicious area of linear enhancement retroareolar  upper outer quadrant left breast. 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 We discussed the [place] and with no symptoms or palpable mass, essentially incidental finding on breast MR I recommended just clinical follow-up. Martha [name] has recommended follow-up MRI in 6 months. She should have excisional biopsy, i.e. duct excision, left breast to include the marking clip and area of enhancement upper outer retroareolar. I will discuss with [name]. If the marking clip is very superficially directly under the biopsy site which I'm able to see today I don't think any further localization would be necessary. Could consider seed placement for localization. This was discussed with the patient. We will await scheduling [name] and then schedule for excisional biopsy (duct excision) left breast under general anesthesia as an outpatient. We discussed the nature of the surgery and possible findings, slight risks of anesthetic complications, bleeding, infection, wound healing problems or need for further surgery based on final pathology. Current Plans Central duct excision left breast under general anesthesia as an outpatient with possible radioactive seed localization pending discussion with Martha [name]

## 2018-07-01 NOTE — Anesthesia Preprocedure Evaluation (Addendum)
Anesthesia Evaluation  Patient identified by MRN, date of birth, ID band Patient awake    Reviewed: Allergy & Precautions, NPO status , Patient's Chart, lab work & pertinent test results  History of Anesthesia Complications Negative for: history of anesthetic complications  Airway Mallampati: II  TM Distance: >3 FB Neck ROM: Full    Dental no notable dental hx. (+) Dental Advisory Given   Pulmonary neg pulmonary ROS,    Pulmonary exam normal        Cardiovascular negative cardio ROS Normal cardiovascular exam     Neuro/Psych negative neurological ROS     GI/Hepatic negative GI ROS, Neg liver ROS,   Endo/Other  negative endocrine ROS  Renal/GU negative Renal ROS     Musculoskeletal negative musculoskeletal ROS (+)   Abdominal   Peds  Hematology negative hematology ROS (+)   Anesthesia Other Findings Day of surgery medications reviewed with the patient.  Reproductive/Obstetrics                            Anesthesia Physical Anesthesia Plan  ASA: II  Anesthesia Plan: General   Post-op Pain Management:    Induction: Intravenous  PONV Risk Score and Plan: 3 and Ondansetron, Dexamethasone and Scopolamine patch - Pre-op  Airway Management Planned: LMA  Additional Equipment:   Intra-op Plan:   Post-operative Plan: Extubation in OR  Informed Consent: I have reviewed the patients History and Physical, chart, labs and discussed the procedure including the risks, benefits and alternatives for the proposed anesthesia with the patient or authorized representative who has indicated his/her understanding and acceptance.     Dental advisory given  Plan Discussed with: CRNA and Anesthesiologist  Anesthesia Plan Comments:        Anesthesia Quick Evaluation

## 2018-07-02 ENCOUNTER — Encounter (HOSPITAL_BASED_OUTPATIENT_CLINIC_OR_DEPARTMENT_OTHER): Payer: Self-pay

## 2018-07-02 ENCOUNTER — Other Ambulatory Visit: Payer: Self-pay

## 2018-07-02 ENCOUNTER — Ambulatory Visit (HOSPITAL_BASED_OUTPATIENT_CLINIC_OR_DEPARTMENT_OTHER)
Admission: RE | Admit: 2018-07-02 | Discharge: 2018-07-02 | Disposition: A | Discharge status: Home / Self Care | Payer: BLUE CROSS/BLUE SHIELD | Attending: General Surgery | Admitting: General Surgery

## 2018-07-02 ENCOUNTER — Ambulatory Visit (HOSPITAL_BASED_OUTPATIENT_CLINIC_OR_DEPARTMENT_OTHER): Payer: BLUE CROSS/BLUE SHIELD | Admitting: Anesthesiology

## 2018-07-02 ENCOUNTER — Ambulatory Visit
Admission: RE | Admit: 2018-07-02 | Discharge: 2018-07-02 | Disposition: A | Discharge status: Home / Self Care | Payer: BLUE CROSS/BLUE SHIELD | Source: Ambulatory Visit | Attending: General Surgery | Admitting: General Surgery

## 2018-07-02 ENCOUNTER — Encounter (HOSPITAL_BASED_OUTPATIENT_CLINIC_OR_DEPARTMENT_OTHER)
Admission: RE | Disposition: A | Discharge status: Home / Self Care | Payer: Self-pay | Source: Home / Self Care | Attending: General Surgery

## 2018-07-02 DIAGNOSIS — R930 Abnormal findings on diagnostic imaging of skull and head, not elsewhere classified: Secondary | ICD-10-CM | POA: Diagnosis not present

## 2018-07-02 DIAGNOSIS — D242 Benign neoplasm of left breast: Secondary | ICD-10-CM | POA: Diagnosis not present

## 2018-07-02 DIAGNOSIS — Z975 Presence of (intrauterine) contraceptive device: Secondary | ICD-10-CM | POA: Insufficient documentation

## 2018-07-02 DIAGNOSIS — N6452 Nipple discharge: Secondary | ICD-10-CM | POA: Diagnosis not present

## 2018-07-02 DIAGNOSIS — Z803 Family history of malignant neoplasm of breast: Secondary | ICD-10-CM | POA: Diagnosis not present

## 2018-07-02 DIAGNOSIS — R928 Other abnormal and inconclusive findings on diagnostic imaging of breast: Secondary | ICD-10-CM | POA: Diagnosis not present

## 2018-07-02 HISTORY — PX: BREAST DUCTAL SYSTEM EXCISION: SHX5242

## 2018-07-02 SURGERY — EXCISION DUCTAL SYSTEM BREAST
Anesthesia: General | Laterality: Left

## 2018-07-02 MED ORDER — SCOPOLAMINE 1 MG/3DAYS TD PT72
MEDICATED_PATCH | TRANSDERMAL | Status: AC
  Filled 2018-07-02: qty 1

## 2018-07-02 MED ORDER — MIDAZOLAM HCL 5 MG/5ML IJ SOLN
INTRAMUSCULAR | Status: DC | PRN
  Administered 2018-07-02: 2 mg via INTRAVENOUS

## 2018-07-02 MED ORDER — DEXAMETHASONE SODIUM PHOSPHATE 10 MG/ML IJ SOLN
INTRAMUSCULAR | Status: AC
  Filled 2018-07-02: qty 1

## 2018-07-02 MED ORDER — FENTANYL CITRATE (PF) 100 MCG/2ML IJ SOLN: 50.0000 ug | INTRAMUSCULAR | Status: DC | PRN

## 2018-07-02 MED ORDER — DEXAMETHASONE SODIUM PHOSPHATE 10 MG/ML IJ SOLN
INTRAMUSCULAR | Status: DC | PRN
  Administered 2018-07-02: 10 mg via INTRAVENOUS

## 2018-07-02 MED ORDER — PROMETHAZINE HCL 25 MG/ML IJ SOLN
6.2500 mg | INTRAMUSCULAR | Status: DC | PRN
  Administered 2018-07-02: 6.25 mg via INTRAVENOUS

## 2018-07-02 MED ORDER — PROPOFOL 10 MG/ML IV BOLUS
INTRAVENOUS | Status: DC | PRN
  Administered 2018-07-02: 200 mg via INTRAVENOUS

## 2018-07-02 MED ORDER — ONDANSETRON HCL 4 MG/2ML IJ SOLN
INTRAMUSCULAR | Status: DC | PRN
  Administered 2018-07-02 (×2): 4 mg via INTRAVENOUS

## 2018-07-02 MED ORDER — CHLORHEXIDINE GLUCONATE CLOTH 2 % EX PADS: 6.0000 | MEDICATED_PAD | Freq: Once | CUTANEOUS | Status: DC

## 2018-07-02 MED ORDER — CELECOXIB 200 MG PO CAPS
200.0000 mg | ORAL_CAPSULE | ORAL | Status: AC
  Administered 2018-07-02: 200 mg via ORAL

## 2018-07-02 MED ORDER — DIPHENHYDRAMINE HCL 50 MG/ML IJ SOLN
INTRAMUSCULAR | Status: DC | PRN
  Administered 2018-07-02: 25 mg via INTRAVENOUS

## 2018-07-02 MED ORDER — GABAPENTIN 300 MG PO CAPS
ORAL_CAPSULE | ORAL | Status: AC
  Filled 2018-07-02: qty 1

## 2018-07-02 MED ORDER — FENTANYL CITRATE (PF) 100 MCG/2ML IJ SOLN: 25.0000 ug | INTRAMUSCULAR | Status: DC | PRN

## 2018-07-02 MED ORDER — FENTANYL CITRATE (PF) 100 MCG/2ML IJ SOLN
INTRAMUSCULAR | Status: AC
  Filled 2018-07-02: qty 2

## 2018-07-02 MED ORDER — SCOPOLAMINE 1 MG/3DAYS TD PT72
1.0000 | MEDICATED_PATCH | TRANSDERMAL | Status: DC
  Administered 2018-07-02: 1.5 mg via TRANSDERMAL

## 2018-07-02 MED ORDER — PROMETHAZINE HCL 25 MG/ML IJ SOLN
INTRAMUSCULAR | Status: AC
  Filled 2018-07-02: qty 1

## 2018-07-02 MED ORDER — MIDAZOLAM HCL 2 MG/2ML IJ SOLN
INTRAMUSCULAR | Status: AC
  Filled 2018-07-02: qty 2

## 2018-07-02 MED ORDER — CELECOXIB 200 MG PO CAPS
ORAL_CAPSULE | ORAL | Status: AC
  Filled 2018-07-02: qty 1

## 2018-07-02 MED ORDER — FENTANYL CITRATE (PF) 100 MCG/2ML IJ SOLN
INTRAMUSCULAR | Status: DC | PRN
  Administered 2018-07-02 (×2): 50 ug via INTRAVENOUS

## 2018-07-02 MED ORDER — BUPIVACAINE HCL (PF) 0.25 % IJ SOLN
INTRAMUSCULAR | Status: AC
  Filled 2018-07-02: qty 30

## 2018-07-02 MED ORDER — MIDAZOLAM HCL 2 MG/2ML IJ SOLN: 1.0000 mg | INTRAMUSCULAR | Status: DC | PRN

## 2018-07-02 MED ORDER — SCOPOLAMINE 1 MG/3DAYS TD PT72: 1.0000 | MEDICATED_PATCH | Freq: Once | TRANSDERMAL | Status: DC | PRN

## 2018-07-02 MED ORDER — ACETAMINOPHEN 500 MG PO TABS
1000.0000 mg | ORAL_TABLET | ORAL | Status: AC
  Administered 2018-07-02: 1000 mg via ORAL

## 2018-07-02 MED ORDER — CEFAZOLIN SODIUM-DEXTROSE 2-4 GM/100ML-% IV SOLN
2.0000 g | INTRAVENOUS | Status: AC
  Administered 2018-07-02: 2 g via INTRAVENOUS

## 2018-07-02 MED ORDER — ONDANSETRON HCL 4 MG/2ML IJ SOLN
INTRAMUSCULAR | Status: AC
  Filled 2018-07-02: qty 4

## 2018-07-02 MED ORDER — LIDOCAINE 2% (20 MG/ML) 5 ML SYRINGE
INTRAMUSCULAR | Status: DC | PRN
  Administered 2018-07-02: 100 mg via INTRAVENOUS

## 2018-07-02 MED ORDER — CEFAZOLIN SODIUM-DEXTROSE 2-4 GM/100ML-% IV SOLN
INTRAVENOUS | Status: AC
  Filled 2018-07-02: qty 100

## 2018-07-02 MED ORDER — GABAPENTIN 300 MG PO CAPS
300.0000 mg | ORAL_CAPSULE | ORAL | Status: AC
  Administered 2018-07-02: 300 mg via ORAL

## 2018-07-02 MED ORDER — OXYCODONE HCL 5 MG PO TABS: 5.0000 mg | ORAL_TABLET | Freq: Four times a day (QID) | ORAL | 0 refills | Status: DC | PRN

## 2018-07-02 MED ORDER — BUPIVACAINE HCL (PF) 0.25 % IJ SOLN
INTRAMUSCULAR | Status: DC | PRN
  Administered 2018-07-02: 30 mL

## 2018-07-02 MED ORDER — SODIUM CHLORIDE (PF) 0.9 % IJ SOLN
INTRAMUSCULAR | Status: AC
  Filled 2018-07-02: qty 10

## 2018-07-02 MED ORDER — ACETAMINOPHEN 500 MG PO TABS
ORAL_TABLET | ORAL | Status: AC
  Filled 2018-07-02: qty 2

## 2018-07-02 MED ORDER — DIPHENHYDRAMINE HCL 50 MG/ML IJ SOLN
INTRAMUSCULAR | Status: AC
  Filled 2018-07-02: qty 1

## 2018-07-02 MED ORDER — LACTATED RINGERS IV SOLN
INTRAVENOUS | Status: DC
  Administered 2018-07-02 (×2): via INTRAVENOUS

## 2018-07-02 MED ORDER — PROPOFOL 10 MG/ML IV BOLUS
INTRAVENOUS | Status: AC
  Filled 2018-07-02: qty 20

## 2018-07-02 SURGICAL SUPPLY — 27 items
BLADE SURG 15 STRL LF DISP TIS (BLADE) ×1 IMPLANT
BLADE SURG 15 STRL SS (BLADE) ×2
CHLORAPREP W/TINT 26ML (MISCELLANEOUS) ×3 IMPLANT
COVER BACK TABLE 60X90IN (DRAPES) ×3 IMPLANT
COVER MAYO STAND STRL (DRAPES) ×3 IMPLANT
DERMABOND ADVANCED (GAUZE/BANDAGES/DRESSINGS) ×2
DERMABOND ADVANCED .7 DNX12 (GAUZE/BANDAGES/DRESSINGS) ×1 IMPLANT
DRAPE UTILITY XL STRL (DRAPES) ×3 IMPLANT
ELECT COATED BLADE 2.86 ST (ELECTRODE) ×3 IMPLANT
ELECT REM PT RETURN 9FT ADLT (ELECTROSURGICAL) ×3
ELECTRODE REM PT RTRN 9FT ADLT (ELECTROSURGICAL) ×1 IMPLANT
GLOVE BIOGEL PI IND STRL 7.0 (GLOVE) ×1 IMPLANT
GLOVE BIOGEL PI IND STRL 8 (GLOVE) ×1 IMPLANT
GLOVE BIOGEL PI INDICATOR 7.0 (GLOVE) ×2
GLOVE BIOGEL PI INDICATOR 8 (GLOVE) ×2
GLOVE ECLIPSE 7.5 STRL STRAW (GLOVE) ×3 IMPLANT
KIT MARKER MARGIN INK (KITS) ×3 IMPLANT
NEEDLE HYPO 25X1 1.5 SAFETY (NEEDLE) ×3 IMPLANT
PACK BASIN DAY SURGERY FS (CUSTOM PROCEDURE TRAY) ×3 IMPLANT
PENCIL BUTTON HOLSTER BLD 10FT (ELECTRODE) ×3 IMPLANT
SHEET MEDIUM DRAPE 40X70 STRL (DRAPES) ×3 IMPLANT
SLEEVE SCD COMPRESS KNEE MED (MISCELLANEOUS) ×3 IMPLANT
SPONGE LAP 4X18 RFD (DISPOSABLE) ×3 IMPLANT
SUT MON AB 5-0 PS2 18 (SUTURE) ×3 IMPLANT
SUT VICRYL 3-0 CR8 SH (SUTURE) ×3 IMPLANT
SYR CONTROL 10ML LL (SYRINGE) ×3 IMPLANT
TOWEL GREEN STERILE FF (TOWEL DISPOSABLE) ×3 IMPLANT

## 2018-07-02 NOTE — Progress Notes (Addendum)
Called [facility] in [employer] on [address] to see if Oxycodone prescription was received. They said that it did not go through and that the pharmacy is having trouble with their electronic system today. Dr. Excell Seltzer notified and he states that he will [facility] as soon as he finishes his current case. Pt updated and Dr. Lear Ng office number given to patient. Pt is agreeable.

## 2018-07-02 NOTE — Anesthesia Procedure Notes (Signed)
Procedure Name: LMA Insertion Date/Time: 07/02/2018 7:42 AM Performed by: Gwyndolyn Saxon, CRNA Pre-anesthesia Checklist: Patient identified, Emergency Drugs available, Suction available and Patient being monitored Patient Re-evaluated:Patient Re-evaluated prior to induction Oxygen Delivery Method: Circle system utilized Preoxygenation: Pre-oxygenation with 100% oxygen Induction Type: IV induction Ventilation: Mask ventilation without difficulty LMA: LMA inserted LMA Size: 4.0 Number of attempts: 1 Placement Confirmation: positive ETCO2 and breath sounds checked- equal and bilateral Tube secured with: Tape Dental Injury: Teeth and Oropharynx as per pre-operative assessment

## 2018-07-02 NOTE — Anesthesia Postprocedure Evaluation (Signed)
Anesthesia Post Note  Patient: Martha [name]  Procedure(s) Performed: LEFT BREAST DUCT EXCISION (Left )     Patient location during evaluation: PACU Anesthesia Type: General Level of consciousness: sedated Pain management: pain level controlled Vital Signs Assessment: post-procedure vital signs reviewed and stable Respiratory status: spontaneous breathing and respiratory function stable Cardiovascular status: stable Postop Assessment: no apparent nausea or vomiting Anesthetic complications: no    Last Vitals:  Vitals:   07/02/18 1000 07/02/18 1019  BP: 124/82 (!) 131/95  Pulse: 67 69  Resp: 15 16  Temp:  36.9 C  SpO2: 100% 100%    Last Pain:  Vitals:   07/02/18 1019  TempSrc: Oral  PainSc: 0-No pain                 Makia Bossi DANIEL

## 2018-07-02 NOTE — Discharge Instructions (Signed)
[facility] °Office Phone Number [phone] ° °BREAST BIOPSY/ PARTIAL MASTECTOMY: POST OP INSTRUCTIONS ° °Always review your discharge instruction sheet given to you by the facility where your surgery was performed. ° °IF YOU HAVE DISABILITY OR FAMILY LEAVE FORMS, YOU MUST BRING THEM TO THE OFFICE FOR PROCESSING.  DO NOT GIVE THEM TO YOUR DOCTOR. ° °1. A prescription for pain medication may be given to you upon discharge.  Take your pain medication as prescribed, if needed.  If narcotic pain medicine is not needed, then you may take acetaminophen (Tylenol) or ibuprofen (Advil) as needed. °2. Take your usually prescribed medications unless otherwise directed °3. If you need a refill on your pain medication, please contact your pharmacy.  They will contact our office to request authorization.  Prescriptions will not be filled after 5pm or on week-ends. °4. You should eat very light the first 24 hours after surgery, such as soup, crackers, pudding, etc.  Resume your normal diet the day after surgery. °5. Most patients will experience some swelling and bruising in the breast.  Ice packs and a good support bra will help.  Swelling and bruising can take several days to resolve.  °6. It is common to experience some constipation if taking pain medication after surgery.  Increasing fluid intake and taking a stool softener will usually help or prevent this problem from occurring.  A mild laxative (Milk of Magnesia or Miralax) should be taken according to package directions if there are no bowel movements after 48 hours. °7. Unless discharge instructions indicate otherwise, you may remove your bandages 24-48 hours after surgery, and you may shower at that time.  You may have steri-strips (small skin tapes) in place directly over the incision.  These strips should be left on the skin for 7-10 days.  If your surgeon used skin glue on the incision, you may shower in 24 hours.  The glue will flake off over the  next 2-3 weeks.  Any sutures or staples will be removed at the office during your follow-up visit. °8. ACTIVITIES:  You may resume regular daily activities (gradually increasing) beginning the next day.  Wearing a good support bra or sports bra minimizes pain and swelling.  You may have sexual intercourse when it is comfortable. °a. You may drive when you no longer are taking prescription pain medication, you can comfortably wear a seatbelt, and you can safely maneuver your car and apply brakes. °b. RETURN TO WORK:  ______________________________________________________________________________________ °9. You should see your doctor in the office for a follow-up appointment approximately two weeks after your surgery.  Your doctor’s nurse will typically make your follow-up appointment when she calls you with your pathology report.  Expect your pathology report 2-3 business days after your surgery.  You may call to check if you do not hear from us after three days. °10. OTHER INSTRUCTIONS: _______________________________________________________________________________________________ _____________________________________________________________________________________________________________________________________ °_____________________________________________________________________________________________________________________________________ °_____________________________________________________________________________________________________________________________________ ° °WHEN TO CALL YOUR DOCTOR: °1. Fever over 101.0 °2. Nausea and/or vomiting. °3. Extreme swelling or bruising. °4. Continued bleeding from incision. °5. Increased pain, redness, or drainage from the incision. ° °The clinic staff is available to answer your questions during regular business hours.  Please don’t hesitate to call and ask to speak to one of the nurses for clinical concerns.  If you have a medical emergency, go to the nearest  emergency room or call 911.  A surgeon from [facility] is always on call at the hospital. ° °For further questions, please visit [email or web address]  ° ° ° °  Post Anesthesia Home Care Instructions  Activity: Get plenty of rest for the remainder of the day. A responsible individual must stay with you for 24 hours following the procedure.  For the next 24 hours, DO NOT: -Drive a car -Paediatric nurse -Drink alcoholic beverages -Take any medication unless instructed by your physician -Make any legal decisions or sign important papers.  Meals: Start with liquid foods such as gelatin or soup. Progress to regular foods as tolerated. Avoid greasy, spicy, heavy foods. If nausea and/or vomiting occur, drink only clear liquids until the nausea and/or vomiting subsides. Call your physician if vomiting continues.  Special Instructions/Symptoms: Your throat may feel dry or sore from the anesthesia or the breathing tube placed in your throat during surgery. If this causes discomfort, gargle with warm salt water. The discomfort should disappear within 24 hours.  If you had a scopolamine patch placed behind your ear for the management of post- operative nausea and/or vomiting:  1. The medication in the patch is effective for 72 hours, after which it should be removed.  Wrap patch in a tissue and discard in the trash. Wash hands thoroughly with soap and water. 2. You may remove the patch earlier than 72 hours if you experience unpleasant side effects which may include dry mouth, dizziness or visual disturbances. 3. Avoid touching the patch. Wash your hands with soap and water after contact with the patch.     You received 6.25 mg of phenergan IV in PACU for nausea.

## 2018-07-02 NOTE — Transfer of Care (Signed)
Immediate Anesthesia Transfer of Care Note  Patient: Martha [name]  Procedure(s) Performed: LEFT BREAST DUCT EXCISION (Left )  Patient Location: PACU  Anesthesia Type:General  Level of Consciousness: drowsy  Airway & Oxygen Therapy: Patient Spontanous Breathing and Patient connected to face mask oxygen  Post-op Assessment: Report given to RN and Post -op Vital signs reviewed and stable  Post vital signs: Reviewed and stable  Last Vitals:  Vitals Value Taken Time  BP 123/86 07/02/2018  8:32 AM  Temp    Pulse 70 07/02/2018  8:34 AM  Resp 14 07/02/2018  8:34 AM  SpO2 100 % 07/02/2018  8:34 AM  Vitals shown include unvalidated device data.  Last Pain:  Vitals:   07/02/18 9371  TempSrc: Oral  PainSc: 4       Patients Stated Pain Goal: 4 (69/67/89 3810)  Complications: No apparent anesthesia complications

## 2018-07-02 NOTE — Interval H&P Note (Signed)
History and Physical Interval Note:  07/02/2018 7:26 AM  Martha [name]  has presented today for surgery, with the diagnosis of left nipple discharge and abnormal mri  The various methods of treatment have been discussed with the patient and family. After consideration of risks, benefits and other options for treatment, the patient has consented to  Procedure(s): LEFT BREAST DUCT EXCISION (Left) as a surgical intervention .  The patient's history has been reviewed, patient examined, no change in status, stable for surgery.  I have reviewed the patient's chart and labs.  Questions were answered to the patient's satisfaction.     Darene Lamer Damarien Nyman

## 2018-07-02 NOTE — Op Note (Signed)
Preoperative Diagnosis: left nipple discharge and abnormal mri  Postoprative Diagnosis: left nipple discharge and abnormal mri  Procedure: Procedure(s): LEFT BREAST DUCT EXCISION   Surgeon: Excell Seltzer T   Assistants: None  Anesthesia:  General LMA anesthesia  Indications: 48 year old female with family history of breast cancer in her mother with persistent single duct clear left nipple discharge and MRI showing a suspicious area of linear enhancement retroareolar upper outer quadrant left breast.  This area was too close to the skin/nipple for needle core biopsy but a marking clip was placed at the site.  With these findings after discussion and consultation we have elected to proceed with excision of this duct.  The hydro-Mark clip placed at the site is actually palpable just under the areola adjacent to the nipple in the upper outer left breast and therefore localization was not performed.    Procedure Detail: Patient was brought to the operating room, placed in the supine position on the operating table, and laryngeal mask general anesthesia induced.  She received preoperative IV antibiotics.  PAS were in place.  The left breast was widely sterilely prepped and draped.  Patient timeout was performed and correct procedure verified.  There was a very discrete small mass palpable just beneath the areola about 1/2 cm from the edge of the nipple.  I used a circumareolar incision in the upper outer quadrant and a flap was developed medially overlying the palpable mass.  I could see a dilated duct with blue fluid in this area.  This tissue was then excised back into the breast for about 2-1/2 cm.  The dissection then was carried anteriorly surrounding the duct and mass up to directly behind the nipple and the ducts were sharply divided directly behind the nipple.  The specimen was oriented with suture.  I did take a specimen x-ray confirming the hydro-Mark clip within the specimen.  There were  no other palpable or visual abnormalities.  Soft tissue was extensively infiltrated with Marcaine.  Hemostasis was obtained with cautery.  The deep breast and subcutaneous tissue was closed with interrupted 3-0 Vicryl and the skin with running subcuticular 5-0 Monocryl and Dermabond.  Sponge needle and instrument counts were correct.    Findings: As above  Estimated Blood Loss:  Minimal         Drains: None  Blood Given: none          Specimens: Left breast ducts and tissue        Complications:  * No complications entered in OR log *         Disposition: PACU - hemodynamically stable.         Condition: stable

## 2018-07-03 ENCOUNTER — Encounter (HOSPITAL_BASED_OUTPATIENT_CLINIC_OR_DEPARTMENT_OTHER): Payer: Self-pay | Admitting: General Surgery

## 2018-07-10 ENCOUNTER — Other Ambulatory Visit: Payer: Self-pay

## 2018-07-10 MED ORDER — OSELTAMIVIR PHOSPHATE 75 MG PO CAPS: 75.0000 mg | ORAL_CAPSULE | Freq: Two times a day (BID) | ORAL | 0 refills | Status: DC

## 2018-07-22 ENCOUNTER — Ambulatory Visit (INDEPENDENT_AMBULATORY_CARE_PROVIDER_SITE_OTHER): Payer: BLUE CROSS/BLUE SHIELD | Admitting: Internal Medicine

## 2018-07-22 ENCOUNTER — Encounter: Payer: Self-pay | Admitting: Internal Medicine

## 2018-07-22 VITALS — BP 148/96 | HR 71 | Temp 98.2°F | Ht 67.4 in | Wt 213.4 lb

## 2018-07-22 DIAGNOSIS — Z6833 Body mass index (BMI) 33.0-33.9, adult: Secondary | ICD-10-CM | POA: Diagnosis not present

## 2018-07-22 DIAGNOSIS — E6609 Other obesity due to excess calories: Secondary | ICD-10-CM | POA: Diagnosis not present

## 2018-07-22 DIAGNOSIS — R030 Elevated blood-pressure reading, without diagnosis of hypertension: Secondary | ICD-10-CM | POA: Diagnosis not present

## 2018-07-22 MED ORDER — PHENTERMINE HCL 37.5 MG PO CAPS: 37.5000 mg | ORAL_CAPSULE | ORAL | 0 refills | Status: DC

## 2018-07-22 NOTE — Progress Notes (Signed)
Subjective:     Patient ID: Martha [name] , female    DOB: Dec 05, 1970 , 48 y.o.   MRN: [id number]   Chief Complaint  Patient presents with  . Obesity    HPI  She is here today for f/u obesity. She has been taking phentermine without any issues. She has not had any issues with the medication.     Past Medical History:  Diagnosis Date  . Infertility associated with anovulation      Family History  Problem Relation Age of Onset  . Hypertension Father   . Diabetes Father   . Breast cancer Mother 63  . Ovarian cancer Maternal Grandmother      Current Outpatient Medications:  .  Cholecalciferol (VITAMIN D) 50 MCG (2000 UT) tablet, Take 2,000 Units by mouth daily., Disp: , Rfl:  .  Glucosamine-Chondroitin-MSM 500-200-150 MG TABS, Take by mouth., Disp: , Rfl:  .  levonorgestrel (MIRENA) 20 MCG/24HR IUD, 1 each by Intrauterine route once., Disp: , Rfl:  .  phentermine 37.5 MG capsule, Take 1 capsule (37.5 mg total) by mouth every morning., Disp: 30 capsule, Rfl: 0 .  doxycycline (ADOXA) 100 MG tablet, Take 100 mg by mouth 2 (two) times daily. For 7 days, Disp: , Rfl:  .  oxyCODONE (OXY IR/ROXICODONE) 5 MG immediate release tablet, Take 1 tablet (5 mg total) by mouth every 6 (six) hours as needed for moderate pain, severe pain or breakthrough pain. (Patient not taking: Reported on 07/22/2018), Disp: 10 tablet, Rfl: 0   No Known Allergies   Review of Systems  Constitutional: Negative.   Respiratory: Negative.   Cardiovascular: Negative.   Gastrointestinal: Negative.   Neurological: Negative.   Psychiatric/Behavioral: Negative.      Today's Vitals   07/22/18 1036  BP: (!) 148/96  Pulse: 71  Temp: 98.2 F (36.8 C)  TempSrc: Oral  Weight: 213 lb 6.4 oz (96.8 kg)  Height: 5' 7.4" (1.712 m)   Body mass index is 33.03 kg/m.   Objective:  Physical Exam Vitals signs and nursing note reviewed.  Constitutional:      Appearance: Normal appearance.  HENT:     Head:  Normocephalic and atraumatic.  Cardiovascular:     Rate and Rhythm: Normal rate and regular rhythm.     Heart sounds: Normal heart sounds.  Pulmonary:     Effort: Pulmonary effort is normal.     Breath sounds: Normal breath sounds.  Skin:    General: Skin is warm.  Neurological:     General: No focal deficit present.     Mental Status: She is alert.  Psychiatric:        Mood and Affect: Mood normal.        Behavior: Behavior normal.         Assessment And Plan:     1. Blood pressure elevated without history of HTN  She is encouraged to avoid adding salt to her foods.  I will re-evaluate at her next visit.   2. Class 1 obesity due to excess calories without serious comorbidity with body mass index (BMI) of 33.0 to 33.9 in adult  She was given rx phentermine. Once she picks this rx up, I will send in rx Qsymia 7.5mg  to take once daily. I hope her insurance pays for it.  If not, she agrees to mail order rx. She will rto in 8 weeks for re-evaluation.   - phentermine 37.5 MG capsule; Take 1 capsule (37.5 mg total) by mouth  every morning.  Dispense: 30 capsule; Refill:   Maximino Greenland, MD

## 2018-07-22 NOTE — Patient Instructions (Signed)
DASH Eating Plan  DASH stands for "Dietary Approaches to Stop Hypertension." The DASH eating plan is a healthy eating plan that has been shown to reduce high blood pressure (hypertension). It may also reduce your risk for type 2 diabetes, heart disease, and stroke. The DASH eating plan may also help with weight loss.  What are tips for following this plan?    General guidelines   Avoid eating more than 2,300 mg (milligrams) of salt (sodium) a day. If you have hypertension, you may need to reduce your sodium intake to 1,500 mg a day.   Limit alcohol intake to no more than 1 drink a day for nonpregnant women and 2 drinks a day for men. One drink equals 12 oz of beer, 5 oz of wine, or 1 oz of hard liquor.   Work with your health care provider to maintain a healthy body weight or to lose weight. Ask what an ideal weight is for you.   Get at least 30 minutes of exercise that causes your heart to beat faster (aerobic exercise) most days of the week. Activities may include walking, swimming, or biking.   Work with your health care provider or diet and nutrition specialist (dietitian) to adjust your eating plan to your individual calorie needs.  Reading food labels     Check food labels for the amount of sodium per serving. Choose foods with less than 5 percent of the Daily Value of sodium. Generally, foods with less than 300 mg of sodium per serving fit into this eating plan.   To find whole grains, look for the word "whole" as the first word in the ingredient list.  Shopping   Buy products labeled as "low-sodium" or "no salt added."   Buy fresh foods. Avoid canned foods and premade or frozen meals.  Cooking   Avoid adding salt when cooking. Use salt-free seasonings or herbs instead of table salt or sea salt. Check with your health care provider or pharmacist before using salt substitutes.   Do not fry foods. Cook foods using healthy methods such as baking, boiling, grilling, and broiling instead.   Cook with  heart-healthy oils, such as olive, canola, soybean, or sunflower oil.  Meal planning   Eat a balanced diet that includes:  ? 5 or more servings of fruits and vegetables each day. At each meal, try to fill half of your plate with fruits and vegetables.  ? Up to 6-8 servings of whole grains each day.  ? Less than 6 oz of lean meat, poultry, or fish each day. A 3-oz serving of meat is about the same size as a deck of cards. One egg equals 1 oz.  ? 2 servings of low-fat dairy each day.  ? A serving of nuts, seeds, or beans 5 times each week.  ? Heart-healthy fats. Healthy fats called Omega-3 fatty acids are found in foods such as flaxseeds and coldwater fish, like sardines, salmon, and mackerel.   Limit how much you eat of the following:  ? Canned or prepackaged foods.  ? Food that is high in trans fat, such as fried foods.  ? Food that is high in saturated fat, such as fatty meat.  ? Sweets, desserts, sugary drinks, and other foods with added sugar.  ? Full-fat dairy products.   Do not salt foods before eating.   Try to eat at least 2 vegetarian meals each week.   Eat more home-cooked food and less restaurant, buffet, and fast food.     When eating at a restaurant, ask that your food be prepared with less salt or no salt, if possible.  What foods are recommended?  The items listed may not be a complete list. Talk with your dietitian about what dietary choices are best for you.  Grains  Whole-grain or whole-wheat bread. Whole-grain or whole-wheat pasta. Brown rice. Oatmeal. Quinoa. Bulgur. Whole-grain and low-sodium cereals. Pita bread. Low-fat, low-sodium crackers. Whole-wheat flour tortillas.  Vegetables  Fresh or frozen vegetables (raw, steamed, roasted, or grilled). Low-sodium or reduced-sodium tomato and vegetable juice. Low-sodium or reduced-sodium tomato sauce and tomato paste. Low-sodium or reduced-sodium canned vegetables.  Fruits  All fresh, dried, or frozen fruit. Canned fruit in natural juice (without  added sugar).  Meat and other protein foods  Skinless chicken or turkey. Ground chicken or turkey. Pork with fat trimmed off. Fish and seafood. Egg whites. Dried beans, peas, or lentils. Unsalted nuts, nut butters, and seeds. Unsalted canned beans. Lean cuts of beef with fat trimmed off. Low-sodium, lean deli meat.  Dairy  Low-fat (1%) or fat-free (skim) milk. Fat-free, low-fat, or reduced-fat cheeses. Nonfat, low-sodium ricotta or cottage cheese. Low-fat or nonfat yogurt. Low-fat, low-sodium cheese.  Fats and oils  Soft margarine without trans fats. Vegetable oil. Low-fat, reduced-fat, or light mayonnaise and salad dressings (reduced-sodium). Canola, safflower, olive, soybean, and sunflower oils. Avocado.  Seasoning and other foods  Herbs. Spices. Seasoning mixes without salt. Unsalted popcorn and pretzels. Fat-free sweets.  What foods are not recommended?  The items listed may not be a complete list. Talk with your dietitian about what dietary choices are best for you.  Grains  Baked goods made with fat, such as croissants, muffins, or some breads. Dry pasta or rice meal packs.  Vegetables  Creamed or fried vegetables. Vegetables in a cheese sauce. Regular canned vegetables (not low-sodium or reduced-sodium). Regular canned tomato sauce and paste (not low-sodium or reduced-sodium). Regular tomato and vegetable juice (not low-sodium or reduced-sodium). Pickles. Olives.  Fruits  Canned fruit in a light or heavy syrup. Fried fruit. Fruit in cream or butter sauce.  Meat and other protein foods  Fatty cuts of meat. Ribs. Fried meat. Bacon. Sausage. Bologna and other processed lunch meats. Salami. Fatback. Hotdogs. Bratwurst. Salted nuts and seeds. Canned beans with added salt. Canned or smoked fish. Whole eggs or egg yolks. Chicken or turkey with skin.  Dairy  Whole or 2% milk, cream, and half-and-half. Whole or full-fat cream cheese. Whole-fat or sweetened yogurt. Full-fat cheese. Nondairy creamers. Whipped toppings.  Processed cheese and cheese spreads.  Fats and oils  Butter. Stick margarine. Lard. Shortening. Ghee. Bacon fat. Tropical oils, such as coconut, palm kernel, or palm oil.  Seasoning and other foods  Salted popcorn and pretzels. Onion salt, garlic salt, seasoned salt, table salt, and sea salt. Worcestershire sauce. Tartar sauce. Barbecue sauce. Teriyaki sauce. Soy sauce, including reduced-sodium. Steak sauce. Canned and packaged gravies. Fish sauce. Oyster sauce. Cocktail sauce. Horseradish that you find on the shelf. Ketchup. Mustard. Meat flavorings and tenderizers. Bouillon cubes. Hot sauce and Tabasco sauce. Premade or packaged marinades. Premade or packaged taco seasonings. Relishes. Regular salad dressings.  Where to find more information:   National Heart, Lung, and Blood Institute: www.nhlbi.nih.gov   American Heart Association: www.heart.org  Summary   The DASH eating plan is a healthy eating plan that has been shown to reduce high blood pressure (hypertension). It may also reduce your risk for type 2 diabetes, heart disease, and stroke.   With the   DASH eating plan, you should limit salt (sodium) intake to 2,300 mg a day. If you have hypertension, you may need to reduce your sodium intake to 1,500 mg a day.   When on the DASH eating plan, aim to eat more fresh fruits and vegetables, whole grains, lean proteins, low-fat dairy, and heart-healthy fats.   Work with your health care provider or diet and nutrition specialist (dietitian) to adjust your eating plan to your individual calorie needs.  This information is not intended to replace advice given to you by your health care provider. Make sure you discuss any questions you have with your health care provider.  Document Released: 04/27/2011 Document Revised: 05/01/2016 Document Reviewed: 05/01/2016  Elsevier Interactive Patient Education  2019 Elsevier Inc.

## 2018-09-23 ENCOUNTER — Ambulatory Visit (INDEPENDENT_AMBULATORY_CARE_PROVIDER_SITE_OTHER): Payer: BLUE CROSS/BLUE SHIELD | Admitting: Internal Medicine

## 2018-09-23 ENCOUNTER — Encounter: Payer: Self-pay | Admitting: Internal Medicine

## 2018-09-23 ENCOUNTER — Other Ambulatory Visit: Payer: Self-pay

## 2018-09-23 VITALS — BP 124/82 | Ht 67.4 in | Wt 224.0 lb

## 2018-09-23 DIAGNOSIS — Z6833 Body mass index (BMI) 33.0-33.9, adult: Secondary | ICD-10-CM

## 2018-09-23 DIAGNOSIS — R030 Elevated blood-pressure reading, without diagnosis of hypertension: Secondary | ICD-10-CM

## 2018-09-23 DIAGNOSIS — E6609 Other obesity due to excess calories: Secondary | ICD-10-CM

## 2018-09-23 MED ORDER — PHENTERMINE HCL 37.5 MG PO CAPS: 37.5000 mg | ORAL_CAPSULE | ORAL | 1 refills | Status: DC

## 2018-09-23 NOTE — Progress Notes (Signed)
Virtual Visit via Video   This visit type was conducted due to national recommendations for restrictions regarding the COVID-19 Pandemic (e.g. social distancing) in an effort to limit this patient's exposure and mitigate transmission in our community.  Due to her co-morbid illnesses, this patient is at least at moderate risk for complications without adequate follow up.  This format is felt to be most appropriate for this patient at this time.  All issues noted in this document were discussed and addressed.  A limited physical exam was performed with this format.    This visit type was conducted due to national recommendations for restrictions regarding the COVID-19 Pandemic (e.g. social distancing) in an effort to limit this patient's exposure and mitigate transmission in our community.  Patients identity confirmed using two different identifiers.  This format is felt to be most appropriate for this patient at this time.  All issues noted in this document were discussed and addressed.  No physical exam was performed (except for noted visual exam findings with Video Visits).    Date:  09/23/2018   ID:  Martha [name], DOB 04-Sep-1970, MRN [id number]  Patient Location:  Home patio  Provider location:   Office    Chief Complaint:  Weight check  History of Present Illness:    Martha [name] is a 48 y.o. female who presents via video conferencing for a telehealth visit today.    The patient does not have symptoms concerning for COVID-19 infection (fever, chills, cough, or new shortness of breath).   She presents today for virtual visit. She prefers this method of contact due to COVID-19 pandemic. She presents today for a weight check. She reports she has been exercising regularly. She feels well on phentermine. She has no specific concerns or complaints at this time.      Past Medical History:  Diagnosis Date   Infertility associated with anovulation    Past Surgical History:    Procedure Laterality Date   BREAST DUCTAL SYSTEM EXCISION Left 07/02/2018   Procedure: LEFT BREAST DUCT EXCISION;  Surgeon: Excell Seltzer, MD;  Location: [facility];  Service: General;  Laterality: Left;   DIAGNOSTIC LAPAROSCOPY  2004   DILATATION & CURRETTAGE/HYSTEROSCOPY WITH RESECTOCOPE  2011   endometrial polyp     Current Meds  Medication Sig   Cholecalciferol (VITAMIN D) 50 MCG (2000 UT) tablet Take 2,000 Units by mouth daily.   Glucosamine-Chondroitin-MSM 500-200-150 MG TABS Take by mouth.   levonorgestrel (MIRENA) 20 MCG/24HR IUD 1 each by Intrauterine route once.   phentermine 37.5 MG capsule Take 1 capsule (37.5 mg total) by mouth every morning.   [DISCONTINUED] phentermine 37.5 MG capsule Take 1 capsule (37.5 mg total) by mouth every morning.     Allergies:   Patient has no known allergies.   Social History   Tobacco Use   Smoking status: Never Smoker   Smokeless tobacco: Never Used  Substance Use Topics   Alcohol use: Yes   Drug use: No     Family Hx: The patient's family history includes Breast cancer (age of onset: 71) in her mother; Diabetes in her father; Hypertension in her father; Ovarian cancer in her maternal grandmother.  ROS:   Please see the history of present illness.    Review of Systems  Constitutional: Negative.   Respiratory: Negative.   Cardiovascular: Negative.   Gastrointestinal: Negative.   Neurological: Negative.   Psychiatric/Behavioral: Negative.     All other systems reviewed and are  negative.   Labs/Other Tests and Data Reviewed:    Recent Labs: No results found for requested labs within last 8760 hours.   Recent Lipid Panel No results found for: CHOL, TRIG, HDL, CHOLHDL, LDLCALC, LDLDIRECT  Wt Readings from Last 3 Encounters:  07/22/18 213 lb 6.4 oz (96.8 kg)  07/02/18 223 lb 15.8 oz (101.6 kg)  05/13/18 215 lb 12.8 oz (97.9 kg)     Exam:    Vital Signs:  There were no vitals taken for  this visit.    Physical Exam  Constitutional: She is oriented to person, place, and time and well-developed, well-nourished, and in no distress.  HENT:  Head: Normocephalic and atraumatic.  Neck: Normal range of motion.  Pulmonary/Chest: Effort normal.  Neurological: She is alert and oriented to person, place, and time.  Psychiatric: Affect normal.  Nursing note and vitals reviewed.   ASSESSMENT & PLAN:     1. Elevated blood pressure reading  She reports she has had her bp checked since her last visit and it has since normalized. She agrees to have her bp checked tomorrow when she goes back into work.   2. Obesity due to excess calories, unspecified classification, unspecified whether serious comorbidity present  She elects to continue with phentermine. She agrees to switch to Qsymia at her next visit since it is not recommended that one stay on phentermine more than six months consecutively.   COVID-19 Education: The signs and symptoms of COVID-19 were discussed with the patient and how to seek care for testing (follow up with PCP or arrange E-visit).  The importance of social distancing was discussed today.  Patient Risk:   After full review of this patients clinical status, I feel that they are at least moderate risk at this time.  Time:   Today, I have spent 7 minutes/ 30 seconds with the patient with telehealth technology discussing above diagnoses.     Medication Adjustments/Labs and Tests Ordered: Current medicines are reviewed at length with the patient today.  Concerns regarding medicines are outlined above.   Tests Ordered: No orders of the defined types were placed in this encounter.   Medication Changes: Meds ordered this encounter  Medications   phentermine 37.5 MG capsule    Sig: Take 1 capsule (37.5 mg total) by mouth every morning.    Dispense:  30 capsule    Refill:  1    Disposition:  Follow up in 8 week(s)  Signed, Maximino Greenland, MD

## 2018-09-23 NOTE — Patient Instructions (Signed)

## 2018-11-18 ENCOUNTER — Ambulatory Visit: Payer: BLUE CROSS/BLUE SHIELD | Admitting: Internal Medicine

## 2018-11-28 ENCOUNTER — Other Ambulatory Visit: Payer: Self-pay

## 2018-11-28 ENCOUNTER — Ambulatory Visit: Payer: BC Managed Care – PPO | Admitting: Internal Medicine

## 2018-12-09 DIAGNOSIS — N898 Other specified noninflammatory disorders of vagina: Secondary | ICD-10-CM | POA: Diagnosis not present

## 2018-12-09 DIAGNOSIS — Z30433 Encounter for removal and reinsertion of intrauterine contraceptive device: Secondary | ICD-10-CM | POA: Diagnosis not present

## 2018-12-09 DIAGNOSIS — R300 Dysuria: Secondary | ICD-10-CM | POA: Diagnosis not present

## 2018-12-26 ENCOUNTER — Ambulatory Visit (INDEPENDENT_AMBULATORY_CARE_PROVIDER_SITE_OTHER): Payer: BC Managed Care – PPO | Admitting: Internal Medicine

## 2018-12-26 ENCOUNTER — Encounter: Payer: Self-pay | Admitting: Internal Medicine

## 2018-12-26 ENCOUNTER — Other Ambulatory Visit: Payer: Self-pay

## 2018-12-26 VITALS — BP 116/82 | HR 85 | Temp 98.3°F | Ht 67.4 in | Wt 228.6 lb

## 2018-12-26 DIAGNOSIS — E6609 Other obesity due to excess calories: Secondary | ICD-10-CM

## 2018-12-26 DIAGNOSIS — Z6835 Body mass index (BMI) 35.0-35.9, adult: Secondary | ICD-10-CM

## 2018-12-26 NOTE — Patient Instructions (Signed)

## 2018-12-30 ENCOUNTER — Encounter: Payer: Self-pay | Admitting: Internal Medicine

## 2018-12-30 NOTE — Progress Notes (Signed)
  Subjective:     Patient ID: Martha [name] , female    DOB: 1971/05/08 , 48 y.o.   MRN: [id number]   Chief Complaint  Patient presents with  . Obesity    HPI  She is here today for f/u obesity.  She does not wish to know her weight. She feels that her clothes fit better. She does not want to get discouraged if she has had some weight gain.     Past Medical History:  Diagnosis Date  . Infertility associated with anovulation      Family History  Problem Relation Age of Onset  . Hypertension Father   . Diabetes Father   . Breast cancer Mother 43  . Ovarian cancer Maternal Grandmother      Current Outpatient Medications:  .  Cholecalciferol (VITAMIN D) 50 MCG (2000 UT) tablet, Take 2,000 Units by mouth daily., Disp: , Rfl:  .  COLLAGEN PO, Take by mouth., Disp: , Rfl:  .  Glucosamine-Chondroitin-MSM 500-200-150 MG TABS, Take by mouth., Disp: , Rfl:  .  levonorgestrel (MIRENA) 20 MCG/24HR IUD, 1 each by Intrauterine route once., Disp: , Rfl:  .  phentermine 37.5 MG capsule, Take 1 capsule (37.5 mg total) by mouth every morning., Disp: 30 capsule, Rfl: 1 .  POTASSIUM PO, Take by mouth., Disp: , Rfl:    No Known Allergies   Review of Systems  Constitutional: Negative.   Respiratory: Negative.   Cardiovascular: Negative.   Gastrointestinal: Negative.   Neurological: Negative.   Psychiatric/Behavioral: Negative.      Today's Vitals   12/26/18 1210  BP: 116/82  Pulse: 85  Temp: 98.3 F (36.8 C)  TempSrc: Oral  Weight: 228 lb 9.6 oz (103.7 kg)  Height: 5' 7.4" (1.712 m)   Body mass index is 35.38 kg/m.   Objective:  Physical Exam Vitals signs and nursing note reviewed.  Constitutional:      Appearance: Normal appearance.  HENT:     Head: Normocephalic and atraumatic.  Cardiovascular:     Rate and Rhythm: Normal rate and regular rhythm.     Heart sounds: Normal heart sounds.  Pulmonary:     Effort: Pulmonary effort is normal.     Breath sounds: Normal  breath sounds.  Skin:    General: Skin is warm.  Neurological:     General: No focal deficit present.     Mental Status: She is alert.  Psychiatric:        Mood and Affect: Mood normal.        Behavior: Behavior normal.         Assessment And Plan:     1. Class 2 obesity due to excess calories without serious comorbidity with body mass index (BMI) of 35.0 to 35.9 in adult  At her request, she was not notified about her weight gain. She will continue with phentermine for another 60 days. At that time, I plan to transition her to Qsymia. She has tolerated this in the past. She is in agreement with her treatment plan. She will rto in 8 weeks for re-evaluation.   Maximino Greenland, MD    THE PATIENT IS ENCOURAGED TO PRACTICE SOCIAL DISTANCING DUE TO THE COVID-19 PANDEMIC.

## 2019-01-02 DIAGNOSIS — F411 Generalized anxiety disorder: Secondary | ICD-10-CM | POA: Diagnosis not present

## 2019-01-03 ENCOUNTER — Other Ambulatory Visit: Payer: Self-pay | Admitting: Internal Medicine

## 2019-01-03 DIAGNOSIS — E6609 Other obesity due to excess calories: Secondary | ICD-10-CM

## 2019-01-03 MED ORDER — PHENTERMINE HCL 37.5 MG PO CAPS: 37.5000 mg | ORAL_CAPSULE | ORAL | 1 refills | Status: DC

## 2019-01-06 DIAGNOSIS — F411 Generalized anxiety disorder: Secondary | ICD-10-CM | POA: Diagnosis not present

## 2019-01-20 DIAGNOSIS — F411 Generalized anxiety disorder: Secondary | ICD-10-CM | POA: Diagnosis not present

## 2019-01-24 DIAGNOSIS — N898 Other specified noninflammatory disorders of vagina: Secondary | ICD-10-CM | POA: Diagnosis not present

## 2019-01-24 DIAGNOSIS — Z113 Encounter for screening for infections with a predominantly sexual mode of transmission: Secondary | ICD-10-CM | POA: Diagnosis not present

## 2019-01-24 DIAGNOSIS — Z1231 Encounter for screening mammogram for malignant neoplasm of breast: Secondary | ICD-10-CM | POA: Diagnosis not present

## 2019-01-24 DIAGNOSIS — Z30431 Encounter for routine checking of intrauterine contraceptive device: Secondary | ICD-10-CM | POA: Diagnosis not present

## 2019-02-03 DIAGNOSIS — F411 Generalized anxiety disorder: Secondary | ICD-10-CM | POA: Diagnosis not present

## 2019-02-24 DIAGNOSIS — R5383 Other fatigue: Secondary | ICD-10-CM | POA: Diagnosis not present

## 2019-02-27 ENCOUNTER — Ambulatory Visit (INDEPENDENT_AMBULATORY_CARE_PROVIDER_SITE_OTHER): Payer: BC Managed Care – PPO | Admitting: Internal Medicine

## 2019-02-27 ENCOUNTER — Other Ambulatory Visit: Payer: Self-pay

## 2019-02-27 ENCOUNTER — Encounter: Payer: Self-pay | Admitting: Internal Medicine

## 2019-02-27 VITALS — BP 128/76 | HR 76 | Temp 98.8°F | Ht 66.8 in | Wt 232.8 lb

## 2019-02-27 DIAGNOSIS — R0683 Snoring: Secondary | ICD-10-CM

## 2019-02-27 DIAGNOSIS — Z6836 Body mass index (BMI) 36.0-36.9, adult: Secondary | ICD-10-CM

## 2019-02-27 DIAGNOSIS — E6609 Other obesity due to excess calories: Secondary | ICD-10-CM | POA: Diagnosis not present

## 2019-02-27 DIAGNOSIS — R5383 Other fatigue: Secondary | ICD-10-CM

## 2019-02-27 DIAGNOSIS — F411 Generalized anxiety disorder: Secondary | ICD-10-CM | POA: Diagnosis not present

## 2019-02-27 DIAGNOSIS — R635 Abnormal weight gain: Secondary | ICD-10-CM

## 2019-02-27 DIAGNOSIS — Z23 Encounter for immunization: Secondary | ICD-10-CM

## 2019-02-27 MED ORDER — QSYMIA 7.5-46 MG PO CP24: 7.5000 mg | ORAL_CAPSULE | Freq: Every day | ORAL | 1 refills | Status: DC

## 2019-02-27 NOTE — Patient Instructions (Signed)
Preventing Influenza, Adult Influenza, more commonly known as "the flu," is a viral infection that mainly affects the respiratory tract. The respiratory tract includes structures that help you breathe, such as the lungs, nose, and throat. The flu causes many common cold symptoms, as well as a high fever and body aches. The flu spreads easily from person to person (is contagious). The flu is most common from December through March. This is called flu season.You can catch the flu virus by:  Breathing in droplets from an infected person's cough or sneeze.  Touching something that was recently contaminated with the virus and then touching your mouth, nose, or eyes. What can I do to lower my risk?        You can decrease your risk of getting the flu by:  Getting a flu shot (influenza vaccination) every year. This is the best way to prevent the flu. A flu shot is recommended for everyone age 6 months and older. ? It is best to get a flu shot in the fall, as soon as it is available. Getting a flu shot during winter or spring instead is still a good idea. Flu season can last into early spring. ? Preventing the flu through vaccination requires getting a new flu shot every year. This is because the flu virus changes slightly (mutates) from one year to the next. Even if a flu shot does not completely protect you from all flu virus mutations, it can reduce the severity of your illness and prevent dangerous complications of the flu. ? If you are pregnant, you can and should get a flu shot. ? If you have had a reaction to the shot in the past or if you are allergic to eggs, check with your health care provider before getting a flu shot. ? Sometimes the vaccine is available as a nasal spray. In some years, the nasal spray has not been as effective against the flu virus. Check with your health care provider if you have questions about this.  Practicing good health habits. This is especially important during  flu season. ? Avoid contact with people who are sick with flu or cold symptoms. ? Wash your hands with soap and water often. If soap and water are not available, use alcohol-based hand sanitizer. ? Avoid touching your hands to your face, especially when you have not washed your hands recently. ? Use a disinfectant to clean surfaces at home and at work that may be contaminated with the flu virus. ? Keep your body's disease-fighting system (immune system) in good shape by eating a healthy diet, drinking plenty of fluids, getting enough sleep, and exercising regularly. If you do get the flu, avoid spreading it to others by:  Staying home until your symptoms have been gone for at least one day.  Covering your mouth and nose when you cough or sneeze.  Avoiding close contact with others, especially babies and elderly people. Why are these changes important? Getting a flu shot and practicing good health habits protects you as well as other people. If you get the flu, your friends, family, and co-workers are also at risk of getting it, because it spreads so easily to others. Each year, about 2 out of every 10 people get the flu. Having the flu can lead to complications, such as pneumonia, ear infection, and sinus infection. The flu also can be deadly, especially for babies, people older than age 65, and people who have serious long-term diseases. How is this treated? Most   people recover from the flu by resting at home and drinking plenty of fluids. However, a prescription antiviral medicine may reduce your flu symptoms and may make your flu go away sooner. This medicine must be started within a few days of getting flu symptoms. You can talk with your health care provider about whether you need an antiviral medicine. Antiviral medicine may be prescribed for people who are at risk for more serious flu symptoms. This includes people who:  Are older than age 65.  Are pregnant.  Have a condition that  makes the flu worse or more dangerous. Where to find more information  Centers for Disease Control and Prevention: www.cdc.gov/flu/index.htm  Flu.gov: www.flu.gov/prevention-vaccination  American Academy of [employer]icians: familydoctor.org/familydoctor/en/kids/vaccines/preventing-the-flu.html Contact a health care provider if:  You have influenza and you develop new symptoms.  You have: ? Chest pain. ? Diarrhea. ? A fever.  Your cough gets worse, or you produce more mucus. Summary  The best way to prevent the flu is to get a flu shot every year in the fall.  Even if you get the flu after you have received the yearly vaccine, your flu may be milder and go away sooner because of your flu shot.  If you get the flu, antiviral medicines that are started with a few days of symptoms may reduce your flu symptoms and may make your flu go away sooner.  You can also help prevent the flu by practicing good health habits. This information is not intended to replace advice given to you by your health care provider. Make sure you discuss any questions you have with your health care provider. Document Released: 05/23/2015 Document Revised: 04/20/2017 Document Reviewed: 01/15/2016 Elsevier Patient Education  2020 Elsevier Inc.  

## 2019-02-28 LAB — INSULIN, RANDOM: INSULIN: 253 u[IU]/mL — ABNORMAL HIGH (ref 2.6–24.9)

## 2019-03-01 ENCOUNTER — Telehealth: Payer: Self-pay

## 2019-03-01 NOTE — Telephone Encounter (Signed)
-----   Message from Glendale Chard, MD sent at 02/28/2019  8:09 AM EDT ----- Your insulin level is quite elevated. I think you would benefit from either Saxenda, daily injections or the Rybelsus, oral agent (you have samples). How much did the pharmacy say the Qsymia would cost? Be sure to activate the savings card.

## 2019-03-01 NOTE — Telephone Encounter (Signed)
1st attempt to give lab results  

## 2019-03-03 NOTE — Progress Notes (Signed)
Subjective:     Patient ID: Martha [name] , female    DOB: 05-15-1971 , 48 y.o.   MRN: [id number]   Chief Complaint  Patient presents with  . Weight Check    HPI   She is here today for evaluation of fatigue and f/u obesity. She reports that she has been feeling more tired than usual. She denies change in her eating and sleeping habits. 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 She had bloodwork performed at her GYN office which she reports were all within normal limits. She is not sure what else could be contributing to her symptoms.     Past Medical History:  Diagnosis Date  . Infertility associated with anovulation      Family History  Problem Relation Age of Onset  . Hypertension Father   . Diabetes Father   . Breast cancer Mother 55  . Ovarian cancer Maternal Grandmother      Current Outpatient Medications:  .  Cholecalciferol (VITAMIN D) 50 MCG (2000 UT) tablet, Take 2,000 Units by mouth daily., Disp: , Rfl:  .  COLLAGEN PO, Take by mouth., Disp: , Rfl:  .  Glucosamine-Chondroitin-MSM 500-200-150 MG TABS, Take by mouth., Disp: , Rfl:  .  levonorgestrel (MIRENA) 20 MCG/24HR IUD, 1 each by Intrauterine route once., Disp: , Rfl:  .  phentermine 37.5 MG capsule, Take 1 capsule (37.5 mg total) by mouth every morning., Disp: 30 capsule, Rfl: 1 .  POTASSIUM PO, Take by mouth., Disp: , Rfl:  .  Phentermine-Topiramate (QSYMIA) 7.5-46 MG CP24, Take 7.5 mg by mouth daily., Disp: 30 capsule, Rfl: 1   No Known Allergies   Review of Systems  Constitutional: Positive for fatigue.  Respiratory: Negative.   Cardiovascular: Negative.   Gastrointestinal: Negative.   Neurological: Negative.   Psychiatric/Behavioral: Negative.      Today's Vitals   02/27/19 1603  BP: 128/76  Pulse: 76  Temp: 98.8 F (37.1 C)  TempSrc: Oral  SpO2: 96%  Weight: 232 lb 12.8 oz (105.6 kg)  Height: 5' 6.8" (1.697 m)   Body mass index is 36.68 kg/m.   Objective:   Physical Exam Vitals signs and nursing note reviewed.  Constitutional:      Appearance: Normal appearance.  HENT:     Head: Normocephalic and atraumatic.  Cardiovascular:     Rate and Rhythm: Normal rate and regular rhythm.     Heart sounds: Normal heart sounds.  Pulmonary:     Effort: Pulmonary effort is normal.     Breath sounds: Normal breath sounds.  Skin:    General: Skin is warm.  Neurological:     General: No focal deficit present.     Mental Status: She is alert.  Psychiatric:        Mood and Affect: Mood normal.        Behavior: Behavior normal.         Assessment And Plan:     1. Fatigue, unspecified type  Her lab results were reviewed in full detail. She was encouraged to increase her water intake, to aim for at least 90 ounces. She will let me know if her symptoms persist. She denies previous workup for OSA.  2. Snoring  She reports her husband states she snores on occasion. She agrees to Neuro evaluation for OSA.   - Ambulatory referral to Neurology  3. Weight gain  She agrees to insulin testing.  It may be appropriate to address her  weight gain from perspective of insulin resistance. She may benefit from GLP-1 therapy.   - Insulin, random(561)  4. Flu vaccine need  She was given flu vacccine to update her immunization history as requested.   5. Class 2 obesity due to excess calories without serious comorbidity with body mass index (BMI) of 36.0 to 36.9 in adult  Importance of achieving optimal weight to decrease risk of cardiovascular disease and cancers was discussed with the patient in full detail. She is encouraged to start slowly - start with 10 minutes twice daily at least three to four days per week and to gradually build to 30 minutes five days weekly. She was given tips to incorporate more activity into her daily routine - take stairs when possible, park farther away from her job, grocery stores, etc.    Maximino Greenland, MD    THE PATIENT  IS ENCOURAGED TO PRACTICE SOCIAL DISTANCING DUE TO THE COVID-19 PANDEMIC.

## 2019-03-04 ENCOUNTER — Telehealth: Payer: Self-pay

## 2019-03-04 NOTE — Telephone Encounter (Signed)
-----   Message from Glendale Chard, MD sent at 02/28/2019  8:09 AM EDT ----- Your insulin level is quite elevated. I think you would benefit from either Saxenda, daily injections or the Rybelsus, oral agent (you have samples). How much did the pharmacy say the Qsymia would cost? Be sure to activate the savings card.

## 2019-03-04 NOTE — Addendum Note (Signed)
Addended by: Roxine Caddy on: 03/04/2019 09:16 AM   Modules accepted: Orders

## 2019-03-04 NOTE — Telephone Encounter (Signed)
2ND ATTEMPT TO GIVE RESULTS

## 2019-03-10 DIAGNOSIS — F411 Generalized anxiety disorder: Secondary | ICD-10-CM | POA: Diagnosis not present

## 2019-03-12 ENCOUNTER — Telehealth: Payer: Self-pay

## 2019-03-12 NOTE — Telephone Encounter (Signed)
The pt was notified that a coupon for Qsymia is being placed up front for pickup and that the pt has to activate the copay card

## 2019-03-19 ENCOUNTER — Encounter: Payer: Self-pay | Admitting: Neurology

## 2019-03-19 ENCOUNTER — Other Ambulatory Visit: Payer: Self-pay

## 2019-03-19 ENCOUNTER — Ambulatory Visit (INDEPENDENT_AMBULATORY_CARE_PROVIDER_SITE_OTHER): Payer: BC Managed Care – PPO | Admitting: Neurology

## 2019-03-19 VITALS — BP 147/98 | HR 73 | Temp 97.6°F | Ht 66.0 in | Wt 234.0 lb

## 2019-03-19 DIAGNOSIS — R030 Elevated blood-pressure reading, without diagnosis of hypertension: Secondary | ICD-10-CM | POA: Diagnosis not present

## 2019-03-19 DIAGNOSIS — R5383 Other fatigue: Secondary | ICD-10-CM

## 2019-03-19 DIAGNOSIS — G4726 Circadian rhythm sleep disorder, shift work type: Secondary | ICD-10-CM | POA: Insufficient documentation

## 2019-03-19 DIAGNOSIS — E6609 Other obesity due to excess calories: Secondary | ICD-10-CM | POA: Diagnosis not present

## 2019-03-19 DIAGNOSIS — R0683 Snoring: Secondary | ICD-10-CM

## 2019-03-19 DIAGNOSIS — Z6833 Body mass index (BMI) 33.0-33.9, adult: Secondary | ICD-10-CM

## 2019-03-19 NOTE — Patient Instructions (Signed)

## 2019-03-19 NOTE — Progress Notes (Signed)
SLEEP MEDICINE CLINIC    Provider:  Larey Seat, MD  Primary Care Physician:  Glendale Chard, [address] [place] [address]     Referring Provider: Glendale Chard, [address] [place],  [place] [address]          Chief Complaint according to patient   Patient presents with:    . 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  I have the pleasure of seeing Dr. Franklyn Lor [name] today, a right -handed African American female with a possible sleep disorder.  She  has a past medical history of Infertility associated with anovulation..laparascopy, cerclage , breast mass removed 04-2019.    Sleep relevant medical history: reported fatigue, snoring and sleepiness, obesity. .  Family medical /sleep history: no other family member on CPAP with OSA, husband uses CPAP.  Social history: Patient is working as Conservation officer, historic buildings  and lives in a household with 3 persons- Family status is married , with 1 daughter, age 25.  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  Pets are present, a golden doodle. . Tobacco use: never.  ETOH use; rare/ socially.  Caffeine intake in form of Coffee( 1 cup) Soda(none ) Tea (rare ) or energy drinks. Regular exercise in form of line dancing. Steperobics, light weight , yoga.    Hobbies : dancing   Sleep habits are as follows: The patient's dinner time is between 6-7 PM.  She takes call- The patient goes to bed at 2 AM and continues to sleep for several hours, wakes for 1 bathroom break.   The preferred sleep position is left or righ side -with the support of 1 pillow. Dreams are reportedly frequent/vivid.  6.30  AM is the usual rise time. The patient wakes up spontaneously before her  alarm. On weekends t rise time is 8.30.  She reports usually feeling  refreshed / restored in AM, but recently not-  Naps are taken infrequently, she can't find the time.    Review of Systems: Out of a complete 14 system review, the patient complains of only the following symptoms, and all other reviewed systems are negative.:  Fatigue, sleepiness/ snoring    How likely are you to doze in the following situations: 0 = not likely, 1 = slight chance, 2 = moderate chance, 3 = high chance   Sitting and Reading? Watching Television? Sitting inactive in a public place (theater or meeting)? As a passenger in a car for an hour without a break? Lying down in the afternoon when circumstances permit? Sitting and talking to someone? Sitting quietly after lunch without alcohol? In a car, while stopped for a few minutes in traffic?   Total = 6/ 24 points   FSS endorsed at 16/ 63 points.  But she feels fatigued.   Social History   Socioeconomic History  . Marital status: Married    Spouse name: Reggie  . Number of children: Not on file  . Years of education: Not on file  . Highest education level: Not on file  Occupational History  . Not on file  Social Needs  . Financial resource strain: Not on file  . Food insecurity    Worry: Not on file  Inability: Not on file  . Transportation needs    Medical: Not on file    Non-medical: Not on file  Tobacco Use  . Smoking status: Never Smoker  . Smokeless tobacco: Never Used  Substance and Sexual Activity  . Alcohol use: Yes    Alcohol/week: 2.0 standard drinks    Types: 1 Glasses of wine, 1 Shots of liquor per week    Comment: pt drinks occasionally  . Drug use: No  . Sexual activity: Yes    Birth control/protection: I.U.D.  Lifestyle  . Physical activity    Days per week: Not on file    Minutes per session: Not on file  . Stress: Not on file  Relationships  . Social Herbalist on phone: Not on file    Gets together: Not on file    Attends religious service: Not on file    Active member  of club or organization: Not on file    Attends meetings of clubs or organizations: Not on file    Relationship status: Not on file  Other Topics Concern  . Not on file  Social History Narrative  . Not on file    Family History  Problem Relation Age of Onset  . Hypertension Father   . Diabetes Father   . Breast cancer Mother 42  . Ovarian cancer Maternal Grandmother     Past Medical History:  Diagnosis Date  . Infertility associated with anovulation     Past Surgical History:  Procedure Laterality Date  . BREAST DUCTAL SYSTEM EXCISION Left 07/02/2018   Procedure: LEFT BREAST DUCT EXCISION;  Surgeon: Excell Seltzer, MD;  Location: [facility];  Service: General;  Laterality: Left;  . DIAGNOSTIC LAPAROSCOPY  2004  . DILATATION & CURRETTAGE/HYSTEROSCOPY WITH RESECTOCOPE  2011   endometrial polyp     Current Outpatient Medications on File Prior to Visit  Medication Sig Dispense Refill  . Cholecalciferol (VITAMIN D) 50 MCG (2000 UT) tablet Take 2,000 Units by mouth daily.    . COLLAGEN PO Take by mouth.    . Glucosamine-Chondroitin-MSM 500-200-150 MG TABS Take by mouth.    . levonorgestrel (MIRENA) 20 MCG/24HR IUD 1 each by Intrauterine route once.    . phentermine 37.5 MG capsule Take 1 capsule (37.5 mg total) by mouth every morning. 30 capsule 1  . Phentermine-Topiramate (QSYMIA) 7.5-46 MG CP24 Take 7.5 mg by mouth daily. 30 capsule 1  . POTASSIUM PO Take by mouth daily.      No current facility-administered medications on file prior to visit.     No Known Allergies  Physical exam:  Today's Vitals   03/19/19 1306  BP: (!) 147/98  Pulse: 73  Temp: 97.6 F (36.4 C)  Weight: 234 lb (106.1 kg)  Height: 5\' 8"  (1.727 m)   Body mass index is 35.58 kg/m.   Wt Readings from Last 3 Encounters:  03/19/19 234 lb (106.1 kg)  02/27/19 232 lb 12.8 oz (105.6 kg)  12/26/18 228 lb 9.6 oz (103.7 kg)     Ht Readings from Last 3 Encounters:  03/19/19 5\' 8"   (1.727 m)  02/27/19 5' 6.8" (1.697 m)  12/26/18 5' 7.4" (1.712 m)      General: The patient is awake, alert and appears not in acute distress. The patient is well groomed. Head: Normocephalic, atraumatic. Neck is supple. Mallampati :4 neck circumference: 16 inches . Nasal airflow patent.  Retrognathia is not  seen.  Dental status:  Intact  Cardiovascular:  Regular rate and cardiac rhythm by pulse,  without distended neck veins. Respiratory: Lungs are clear to auscultation.  Skin:  Without evidence of ankle edema, or rash. Trunk: The patient's posture is erect.   Neurologic exam : The patient is awake and alert, oriented to place and time.   Memory subjective described as intact.  Attention span & concentration ability appears normal.  Speech is fluent,  without  dysarthria, dysphonia or aphasia.  Mood and affect are appropriate.   Cranial nerves: no loss of smell or taste reported  Pupils are equal and briskly reactive to light. Funduscopic exam deferred. .  Extraocular movements in vertical and horizontal planes were intact and without nystagmus. No Diplopia. Visual fields by finger perimetry are intact. Hearing was intact to soft voice and finger rubbing.   Facial sensation intact to fine touch. Facial motor strength is symmetric and tongue and uvula move midline.  Neck ROM : rotation, tilt and flexion extension were normal for age and shoulder shrug was symmetrical.    Motor exam:  Symmetric bulk, tone and ROM.   Normal tone without cog wheeling, symmetric grip strength .  Sensory:  Fine touch, pinprick and vibration were tested  and  normal.  Proprioception tested in the upper extremities was normal.  Coordination: no changes in penmanship.Rapid alternating movements in the fingers/hands were of normal speed.  The Finger-to-nose maneuver was intact without evidence of ataxia, dysmetria or tremor. Gait and station: Patient could rise unassisted from a seated position, walked  without assistive device.  Toe and heel walk were deferred.  Deep tendon reflexes: in the  upper and lower extremities are symmetric and intact.  Babinski response was deferred .       After spending a total time of  minutes face to face and additional time for physical and neurologic examination, review of laboratory studies,  personal review of imaging studies, reports and results of other testing and review of referral information / records as far as provided in visit, I have established the following assessments:  1)  Rising fatigue - Evaluate for sleep apnea, OSA. Since snoring is present.   2)  Sleep routines and advancing sleep time to before midnight.  Set aside time to sleep.   3) no RLS, no sleep walking.    My Plan is to proceed with:  1) HST should suffice , no attended study is needed.    I would like to thank Glendale Chard, [address],  [place] [address] for allowing me to meet with and to take care of this pleasant patient.   In short,Dr.  Betsy [name] will follow up either personally with me or through our NP within 2-3 month.   CC: I will share my notes with PCP.   Electronically signed by: Larey Seat, MD 03/19/2019 1:14 PM  [facility] Board certified by The [employer] of Sleep Medicine and Diplomate of the [employer] of Sleep Medicine. Board certified In Neurology through the [place], Fellow of the [employer] of Neurology. Medical Director of [facility].

## 2019-03-27 DIAGNOSIS — F411 Generalized anxiety disorder: Secondary | ICD-10-CM | POA: Diagnosis not present

## 2019-04-09 ENCOUNTER — Ambulatory Visit (INDEPENDENT_AMBULATORY_CARE_PROVIDER_SITE_OTHER): Payer: BC Managed Care – PPO | Admitting: Neurology

## 2019-04-09 DIAGNOSIS — R5383 Other fatigue: Secondary | ICD-10-CM

## 2019-04-09 DIAGNOSIS — R0683 Snoring: Secondary | ICD-10-CM

## 2019-04-09 DIAGNOSIS — Z6833 Body mass index (BMI) 33.0-33.9, adult: Secondary | ICD-10-CM

## 2019-04-09 DIAGNOSIS — G4733 Obstructive sleep apnea (adult) (pediatric): Secondary | ICD-10-CM

## 2019-04-09 DIAGNOSIS — R030 Elevated blood-pressure reading, without diagnosis of hypertension: Secondary | ICD-10-CM

## 2019-04-09 DIAGNOSIS — E6609 Other obesity due to excess calories: Secondary | ICD-10-CM

## 2019-04-28 DIAGNOSIS — G4733 Obstructive sleep apnea (adult) (pediatric): Secondary | ICD-10-CM | POA: Insufficient documentation

## 2019-04-28 NOTE — Addendum Note (Signed)
Addended by: Larey Seat on: 04/28/2019 12:26 PM   Modules accepted: Orders

## 2019-04-28 NOTE — Procedures (Signed)
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 Surgical history of: Laparoscopy, cerclage, surgical removal of breast mass. Sleep relevant medical history: reported fatigue, snoring and sleepiness, and non -restorative sleep. Late bedtime around 2 AM and early rise. Obesity at BMI over 35 kg/m2.        Summary & Diagnosis:    The HST shows a mild sleep apnea at AHI 13.2/h and strong exacerbation during REM sleep to AHI 35/h. There was loud snoring recorded. No clinically significant degree of hypoxia was noted, Heart rate variability was within normal range. There is noted exacerbation of AHI in supine sleep position , but non supine AHI still was 10/h and would warrant treatment.   Recommendations:      With such a strong REM sleep dependence the most effective treatment is by positive airway pressure. Dental devices do not reduce REM sleep dependent apnea well. Inspire procedure is not warranted. I recommend sleeping on the side, using CPAP autotitration device with a pressure setting between 6 and 16 cm water, and 2 cm EPR (expiratory pressure relief). The mask should reflect the desire for a lateral sleep position. CPAP with heated humidity for comfort.  Weight loss can be helpful, discussion will be through PCP.   Interpreting Physician: Larey Seat, MD            Sleep Summary  Oxygen Saturation Statistics   Start Study Time: End Study Time: Total Recording Time:         11:42:45 PM       7:03:02 AM 7 h, 20 min  Total Sleep Time % REM of Sleep Time:  6 h, 28 min  24.2    Mean: 94 Minimum: 89 Maximum: 99  Mean of Desaturations Nadirs (%):   91  Oxygen Desaturation. %:   4-9 10-20 >20 Total  Events Number Total    39  1 95.1 2.4  1 2.4  41 100.0  Oxygen Saturation: <90 <=88 <85 <80 <70  Duration (minutes): Sleep % 0.2 0.0  0.0 0.0  0.0 0.0 0.0 0.0 0.0 0.0     Respiratory Indices      Total Events REM NREM All Night  pRDI:  132  pAHI:  84 ODI:  41  pAHIc:  0  % CSR: 0.0 40.8 35.0 16.2 0.0 14.2 6.2 3.3 0.0 20.6 13.1 6.4 0.0       Pulse Rate Statistics during Sleep (BPM)      Mean: 73 Minimum: 53 Maximum: 130    Indices are calculated using technically valid sleep time of 6 h, 24 min. Central-Indices are calculated using technically valid sleep time of  6  h, 12 min. pRDI/pAHI are calculated using 02 desaturations ? 3%  Body Position Statistics  Position Supine Prone Right Left Non-Supine  Sleep (min) 231.5 115.5 0.0 41.0 156.5  Sleep % 59.7 29.8 0.0 10.6 40.3  pRDI 27.8 5.2 N/A 23.8 10.1  pAHI  18.9 2.6 N/A 10.4 4.6  ODI 8.9 2.6 N/A 3.0 2.7     Snoring Statistics Snoring Level (dB) >40 >50 >60 >70 >80 >Threshold (45)  Sleep (min) 348.3 7.0 2.1 0.7 0.0 30.1  Sleep % 89.8 1.8 0.5 0.2 0.0 7.8    Mean: 42 dB Sleep Stages Chart                    pAHI=13.1

## 2019-04-29 ENCOUNTER — Telehealth: Payer: Self-pay | Admitting: Neurology

## 2019-04-29 DIAGNOSIS — Z1159 Encounter for screening for other viral diseases: Secondary | ICD-10-CM | POA: Diagnosis not present

## 2019-04-29 NOTE — Telephone Encounter (Signed)
I called pt. I advised pt that Dr. Brett Fairy reviewed their sleep study results and found that pt has sleep apnea. Dr. Brett Fairy recommends that pt . I reviewed PAP compliance expectations with the pt. Pt is agreeable to starting a CPAP. I advised pt that an order will be sent to a DME, [facility], and [facility] will call the pt within about one week after they file with the pt's insurance. [facility] will show the pt how to use the machine, fit for masks, and troubleshoot the CPAP if needed. A follow up appt will need to be made for insurance purposes with NP or MD in 31-90 days after starting the machine. Pt verbalized understanding to arrive 15 minutes early and bring their CPAP. A letter with all of this information in it will be mailed to the pt as a reminder. I verified with the pt that the address we have on file is correct. Pt verbalized understanding of results. Pt had no questions at this time but was encouraged to call back if questions arise. I have sent the order to [facility] and have received confirmation that they have received the order.

## 2019-04-29 NOTE — Telephone Encounter (Signed)
-----   Message from Larey Seat, MD sent at 04/28/2019 12:26 PM EST ----- Summary of Dr. Gwynneth Munson Chisom's HST results-  Summary & Diagnosis:   The HST shows a mild sleep apnea at AHI 13.2/h and strong  exacerbation during REM sleep to AHI 35/h. There was loud snoring  recorded. No clinically significant degree of hypoxia was noted,  Heart rate variability was within normal range. There is noted  exacerbation of AHI in supine sleep position , but non supine AHI  still was 10/h and would warrant treatment.   Recommendations:     With such a strong REM sleep dependence the most effective  OSA ( obstructive Sleep Apnea) treatment is by positive airway pressure. Dental devices do not  reduce REM sleep dependent apnea well. Inspire procedure is not  warranted.  I recommend sleeping on the side, using CPAP autotitration device  with a pressure setting between 6 and 16 cm water, and 2 cm EPR  (expiratory pressure relief). The mask should reflect the desire  for a lateral sleep position. CPAP with heated humidity for  comfort.  Weight loss can be helpful, discussion will be through PCP.   Interpreting Physician: Larey Seat, MD

## 2019-05-05 ENCOUNTER — Ambulatory Visit: Payer: BC Managed Care – PPO | Admitting: Internal Medicine

## 2019-05-08 DIAGNOSIS — F411 Generalized anxiety disorder: Secondary | ICD-10-CM | POA: Diagnosis not present

## 2019-05-20 ENCOUNTER — Encounter: Payer: Self-pay | Admitting: Internal Medicine

## 2019-05-21 DIAGNOSIS — Z1159 Encounter for screening for other viral diseases: Secondary | ICD-10-CM | POA: Diagnosis not present

## 2019-05-26 DIAGNOSIS — F411 Generalized anxiety disorder: Secondary | ICD-10-CM | POA: Diagnosis not present

## 2019-05-27 NOTE — Telephone Encounter (Signed)
Received a notification from [place] "12/17, spoke with patient, she did not have a minute to talk but said she would give me a call back. 12/28, called patient, no answer LVM. 12/31, called to follow up, no answer LVM. 01/04, called to follow up, someone picked up the phone and said Martha [name] was not available but she would give her to message to call back. I am voiding the order, just wanted to make you all aware. "  Pt will have to contact them when ready to start

## 2019-06-10 DIAGNOSIS — F411 Generalized anxiety disorder: Secondary | ICD-10-CM | POA: Diagnosis not present

## 2019-06-16 DIAGNOSIS — F411 Generalized anxiety disorder: Secondary | ICD-10-CM | POA: Diagnosis not present

## 2019-06-26 DIAGNOSIS — F411 Generalized anxiety disorder: Secondary | ICD-10-CM | POA: Diagnosis not present

## 2019-07-15 DIAGNOSIS — F411 Generalized anxiety disorder: Secondary | ICD-10-CM | POA: Diagnosis not present

## 2019-07-28 DIAGNOSIS — F411 Generalized anxiety disorder: Secondary | ICD-10-CM | POA: Diagnosis not present

## 2019-08-04 DIAGNOSIS — F411 Generalized anxiety disorder: Secondary | ICD-10-CM | POA: Diagnosis not present

## 2019-08-14 DIAGNOSIS — F411 Generalized anxiety disorder: Secondary | ICD-10-CM | POA: Diagnosis not present

## 2019-09-01 DIAGNOSIS — F411 Generalized anxiety disorder: Secondary | ICD-10-CM | POA: Diagnosis not present

## 2019-09-08 DIAGNOSIS — F411 Generalized anxiety disorder: Secondary | ICD-10-CM | POA: Diagnosis not present

## 2019-09-16 DIAGNOSIS — F411 Generalized anxiety disorder: Secondary | ICD-10-CM | POA: Diagnosis not present

## 2019-09-30 DIAGNOSIS — F411 Generalized anxiety disorder: Secondary | ICD-10-CM | POA: Diagnosis not present

## 2019-10-07 DIAGNOSIS — F411 Generalized anxiety disorder: Secondary | ICD-10-CM | POA: Diagnosis not present

## 2019-10-16 IMAGING — MG MM CLIP PLACEMENT
2 series · 2 of 2 positions shown · non-contrast
Comparison: Previous exam(s).

CLINICAL DATA: Status post MR guided core biopsy of non mass
enhancement in the LOWER OUTER QUADRANT of the LEFT breast and MR
guided placement of a clip in the UPPER-OUTER QUADRANT of the LEFT
breast near the nipple.

EXAM:
DIAGNOSTIC LEFT MAMMOGRAM POST MRI BIOPSY

[L CC]
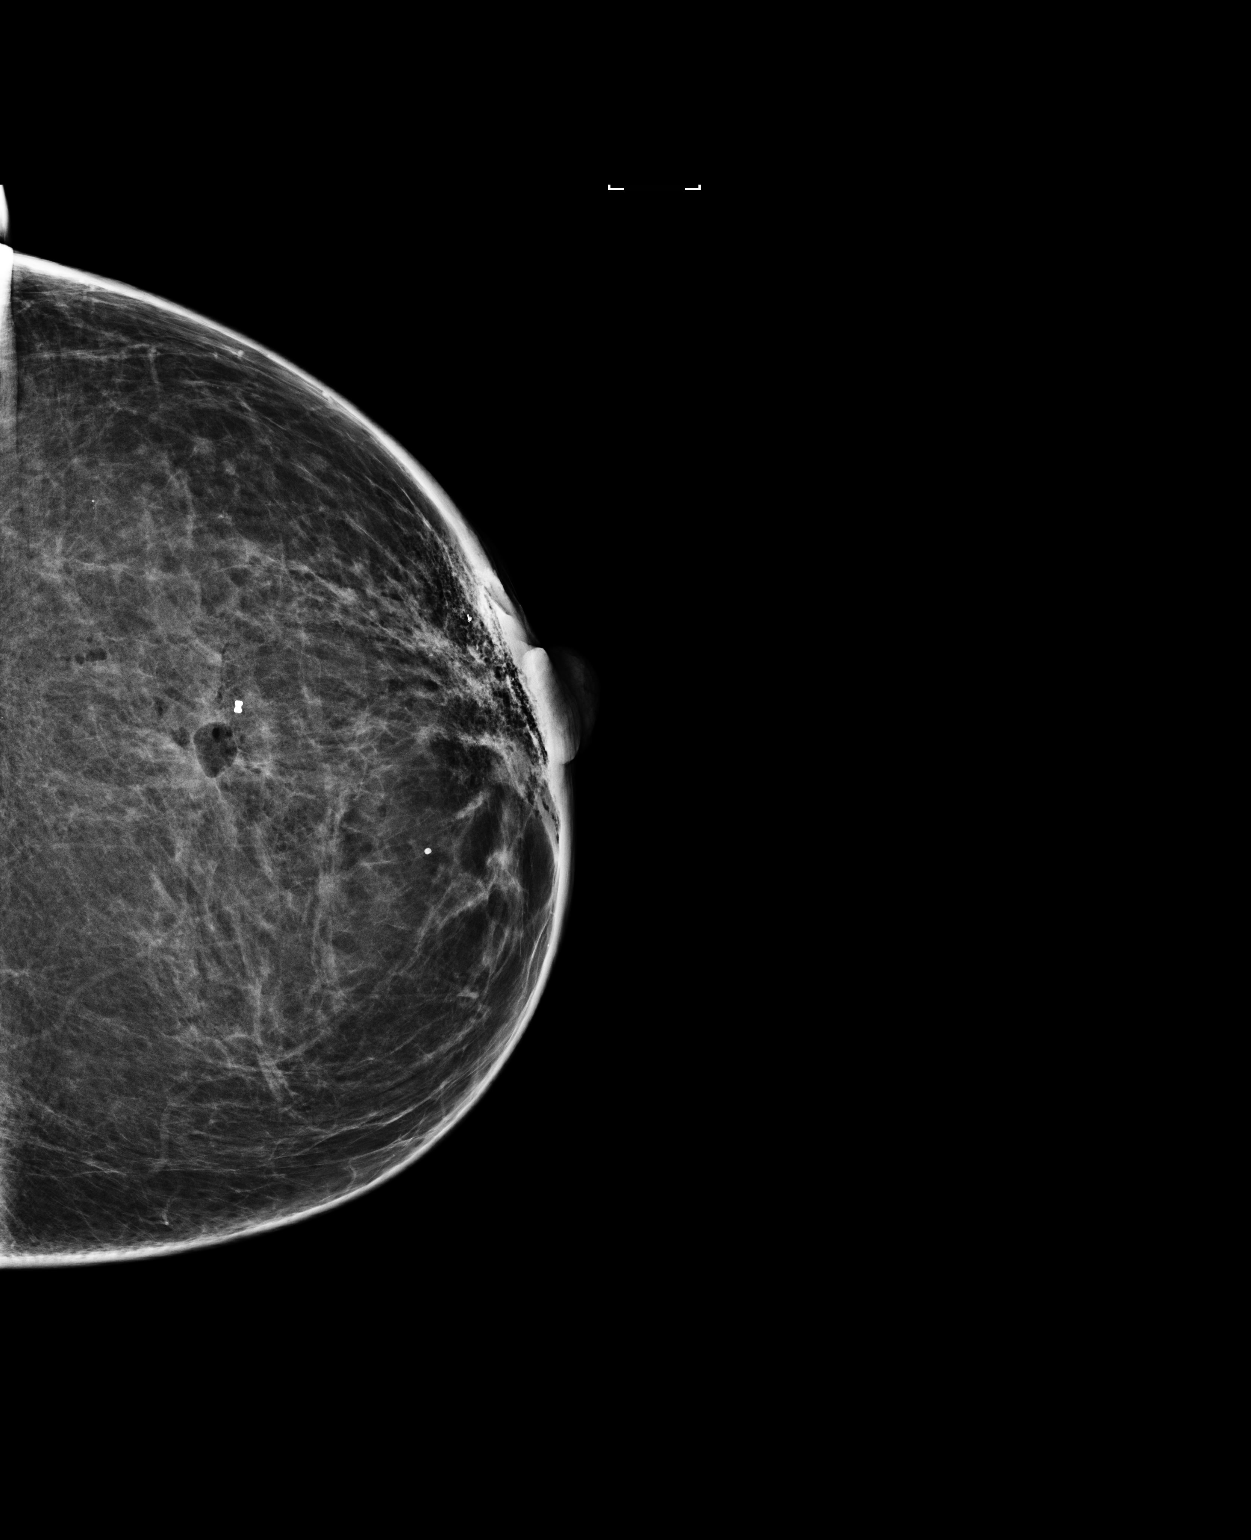

[L ML]
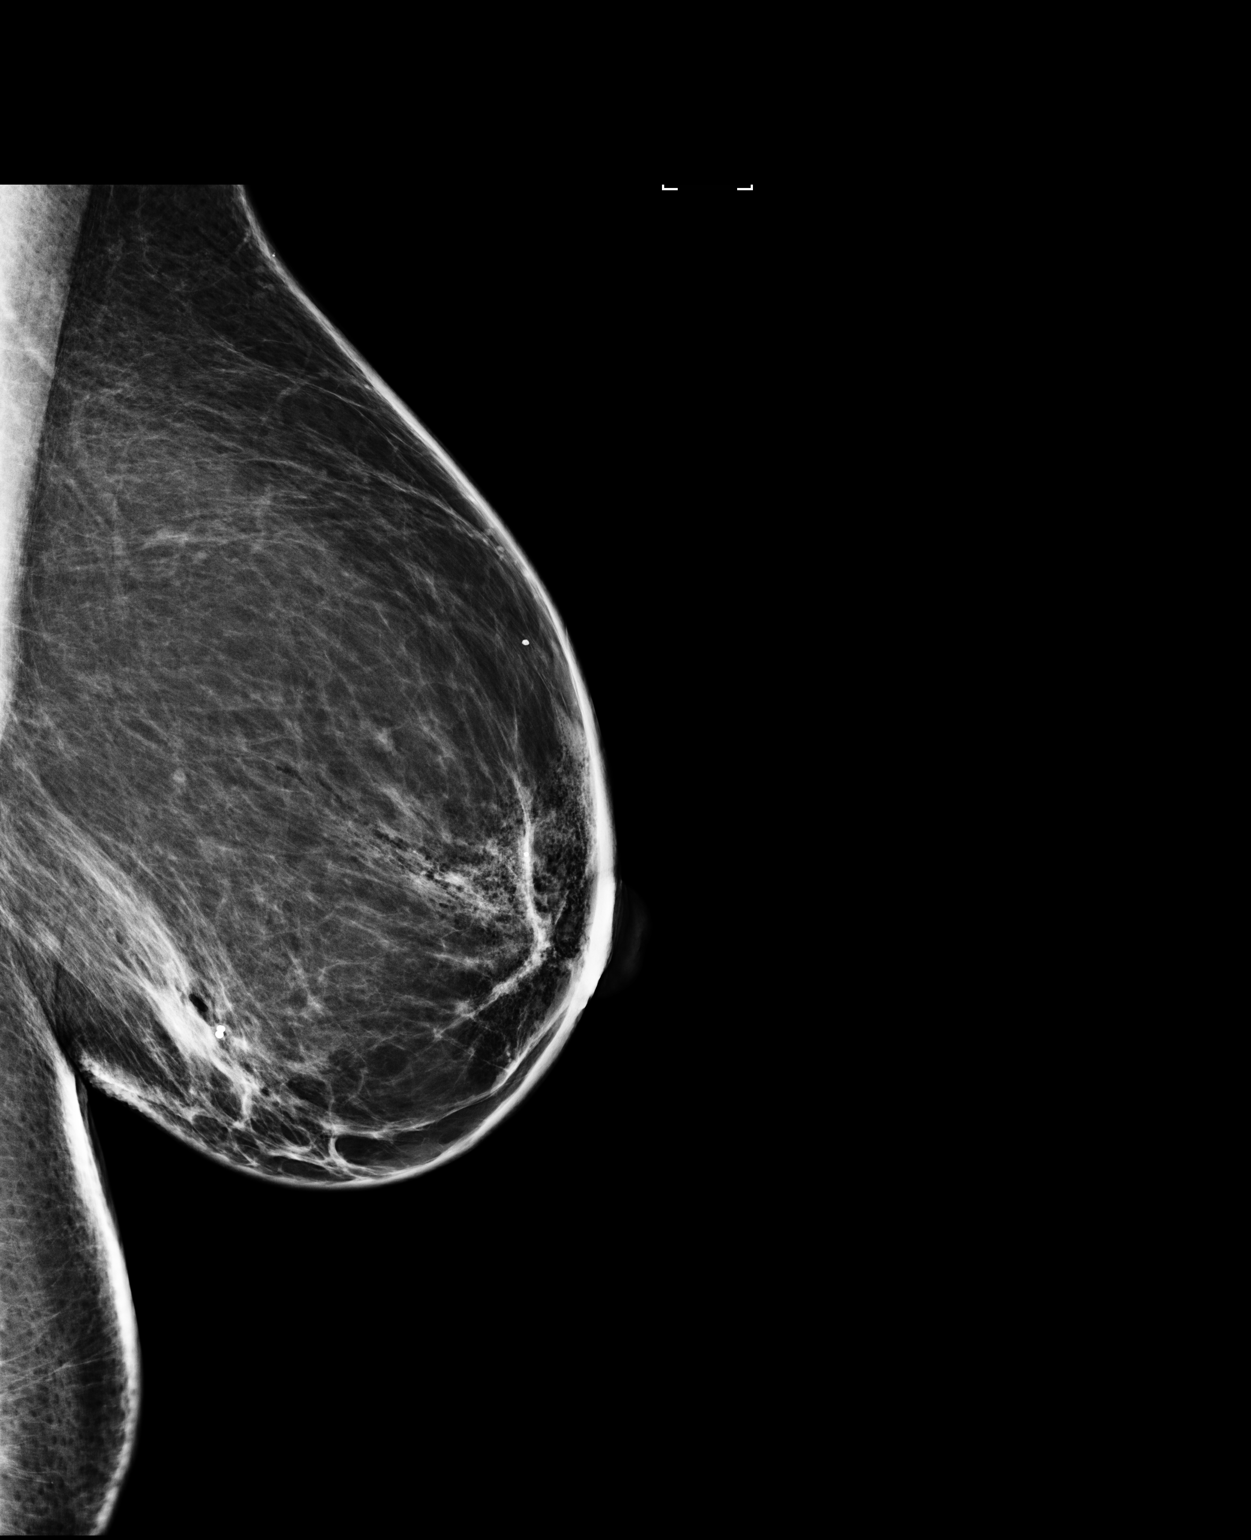

[2 of 2 positions shown; findings below may reference images not displayed]

FINDINGS: Mammographic images were obtained following MRI guided biopsy of non
mass enhancement in the LOWER OUTER QUADRANT of the LEFT breast and
placement of a dumbbell-shaped tissue marker clip. Dumbbell shaped
clip is identified in the LOWER OUTER QUADRANT of the LEFT breast,
as expected following biopsy.

A HydroMARK clip was placed in the anterior UPPER OUTER QUADRANT of
the LEFT breast with MRI guidance. A HydroMARK butterfly shaped clip
is identified in the anterior UPPER OUTER QUADRANT of the LEFT
breast, just under the skin, as expected.
IMPRESSION: Tissue marker clips are in the expected locations after biopsy.

Final Assessment: Post Procedure Mammograms for Marker Placement

## 2019-10-27 DIAGNOSIS — F411 Generalized anxiety disorder: Secondary | ICD-10-CM | POA: Diagnosis not present

## 2019-11-10 DIAGNOSIS — F411 Generalized anxiety disorder: Secondary | ICD-10-CM | POA: Diagnosis not present

## 2019-12-02 DIAGNOSIS — F411 Generalized anxiety disorder: Secondary | ICD-10-CM | POA: Diagnosis not present

## 2019-12-10 DIAGNOSIS — F411 Generalized anxiety disorder: Secondary | ICD-10-CM | POA: Diagnosis not present

## 2019-12-30 DIAGNOSIS — F411 Generalized anxiety disorder: Secondary | ICD-10-CM | POA: Diagnosis not present

## 2020-01-20 DIAGNOSIS — F411 Generalized anxiety disorder: Secondary | ICD-10-CM | POA: Diagnosis not present

## 2020-01-21 DIAGNOSIS — D122 Benign neoplasm of ascending colon: Secondary | ICD-10-CM | POA: Diagnosis not present

## 2020-01-21 DIAGNOSIS — K635 Polyp of colon: Secondary | ICD-10-CM | POA: Diagnosis not present

## 2020-01-21 DIAGNOSIS — D125 Benign neoplasm of sigmoid colon: Secondary | ICD-10-CM | POA: Diagnosis not present

## 2020-01-21 DIAGNOSIS — Z1211 Encounter for screening for malignant neoplasm of colon: Secondary | ICD-10-CM | POA: Diagnosis not present

## 2020-01-21 LAB — HM COLONOSCOPY

## 2020-01-23 ENCOUNTER — Encounter: Payer: Self-pay | Admitting: Internal Medicine

## 2020-02-12 ENCOUNTER — Encounter: Payer: Self-pay | Admitting: Internal Medicine

## 2020-02-26 DIAGNOSIS — F411 Generalized anxiety disorder: Secondary | ICD-10-CM | POA: Diagnosis not present

## 2020-04-10 DIAGNOSIS — Z20822 Contact with and (suspected) exposure to covid-19: Secondary | ICD-10-CM | POA: Diagnosis not present

## 2020-04-10 DIAGNOSIS — Z03818 Encounter for observation for suspected exposure to other biological agents ruled out: Secondary | ICD-10-CM | POA: Diagnosis not present

## 2020-04-20 DIAGNOSIS — F411 Generalized anxiety disorder: Secondary | ICD-10-CM | POA: Diagnosis not present

## 2020-04-27 DIAGNOSIS — Z1159 Encounter for screening for other viral diseases: Secondary | ICD-10-CM | POA: Diagnosis not present

## 2020-05-05 DIAGNOSIS — F411 Generalized anxiety disorder: Secondary | ICD-10-CM | POA: Diagnosis not present

## 2020-10-27 DIAGNOSIS — F411 Generalized anxiety disorder: Secondary | ICD-10-CM | POA: Diagnosis not present

## 2020-10-27 DIAGNOSIS — M542 Cervicalgia: Secondary | ICD-10-CM | POA: Diagnosis not present

## 2020-11-23 DIAGNOSIS — F4322 Adjustment disorder with anxiety: Secondary | ICD-10-CM | POA: Diagnosis not present

## 2020-12-02 DIAGNOSIS — M25512 Pain in left shoulder: Secondary | ICD-10-CM | POA: Diagnosis not present

## 2020-12-02 DIAGNOSIS — M542 Cervicalgia: Secondary | ICD-10-CM | POA: Diagnosis not present

## 2020-12-02 DIAGNOSIS — F4322 Adjustment disorder with anxiety: Secondary | ICD-10-CM | POA: Diagnosis not present

## 2020-12-08 DIAGNOSIS — F411 Generalized anxiety disorder: Secondary | ICD-10-CM | POA: Diagnosis not present

## 2020-12-17 DIAGNOSIS — M7662 Achilles tendinitis, left leg: Secondary | ICD-10-CM | POA: Diagnosis not present

## 2020-12-17 DIAGNOSIS — M79672 Pain in left foot: Secondary | ICD-10-CM | POA: Diagnosis not present

## 2020-12-19 DIAGNOSIS — F4322 Adjustment disorder with anxiety: Secondary | ICD-10-CM | POA: Diagnosis not present

## 2020-12-22 DIAGNOSIS — F411 Generalized anxiety disorder: Secondary | ICD-10-CM | POA: Diagnosis not present

## 2020-12-28 DIAGNOSIS — F4322 Adjustment disorder with anxiety: Secondary | ICD-10-CM | POA: Diagnosis not present

## 2021-01-11 DIAGNOSIS — F4322 Adjustment disorder with anxiety: Secondary | ICD-10-CM | POA: Diagnosis not present

## 2021-02-02 DIAGNOSIS — F411 Generalized anxiety disorder: Secondary | ICD-10-CM | POA: Diagnosis not present

## 2021-02-05 DIAGNOSIS — Z20822 Contact with and (suspected) exposure to covid-19: Secondary | ICD-10-CM | POA: Diagnosis not present

## 2021-02-22 DIAGNOSIS — F411 Generalized anxiety disorder: Secondary | ICD-10-CM | POA: Diagnosis not present

## 2021-03-15 DIAGNOSIS — F411 Generalized anxiety disorder: Secondary | ICD-10-CM | POA: Diagnosis not present

## 2021-04-12 DIAGNOSIS — F411 Generalized anxiety disorder: Secondary | ICD-10-CM | POA: Diagnosis not present

## 2021-07-06 ENCOUNTER — Ambulatory Visit (INDEPENDENT_AMBULATORY_CARE_PROVIDER_SITE_OTHER): Payer: 59 | Admitting: Nurse Practitioner

## 2021-07-06 ENCOUNTER — Encounter: Payer: Self-pay | Admitting: Internal Medicine

## 2021-07-06 ENCOUNTER — Other Ambulatory Visit: Payer: Self-pay

## 2021-07-06 ENCOUNTER — Encounter: Payer: Self-pay | Admitting: Nurse Practitioner

## 2021-07-06 VITALS — BP 138/88 | HR 67 | Temp 97.9°F | Ht 68.6 in | Wt 231.6 lb

## 2021-07-06 DIAGNOSIS — E662 Morbid (severe) obesity with alveolar hypoventilation: Secondary | ICD-10-CM | POA: Diagnosis not present

## 2021-07-06 DIAGNOSIS — Z13228 Encounter for screening for other metabolic disorders: Secondary | ICD-10-CM

## 2021-07-06 DIAGNOSIS — R599 Enlarged lymph nodes, unspecified: Secondary | ICD-10-CM | POA: Diagnosis not present

## 2021-07-06 DIAGNOSIS — R030 Elevated blood-pressure reading, without diagnosis of hypertension: Secondary | ICD-10-CM | POA: Diagnosis not present

## 2021-07-06 DIAGNOSIS — Z6834 Body mass index (BMI) 34.0-34.9, adult: Secondary | ICD-10-CM

## 2021-07-06 LAB — CBC WITH DIFFERENTIAL/PLATELET
Basophils Absolute: 0.1 10*3/uL (ref 0.0–0.2)
Basos: 1 %
EOS (ABSOLUTE): 0.2 10*3/uL (ref 0.0–0.4)
Eos: 2 %
Hematocrit: 40.8 % (ref 34.0–46.6)
Hemoglobin: 13.8 g/dL (ref 11.1–15.9)
Immature Grans (Abs): 0.1 10*3/uL (ref 0.0–0.1)
Immature Granulocytes: 1 %
Lymphocytes Absolute: 5.1 10*3/uL — ABNORMAL HIGH (ref 0.7–3.1)
Lymphs: 43 %
MCH: 30.1 pg (ref 26.6–33.0)
MCHC: 33.8 g/dL (ref 31.5–35.7)
MCV: 89 fL (ref 79–97)
Monocytes Absolute: 0.5 10*3/uL (ref 0.1–0.9)
Monocytes: 4 %
Neutrophils Absolute: 5.7 10*3/uL (ref 1.4–7.0)
Neutrophils: 49 %
Platelets: 236 10*3/uL (ref 150–450)
RBC: 4.59 x10E6/uL (ref 3.77–5.28)
RDW: 13.5 % (ref 11.7–15.4)
WBC: 11.7 10*3/uL — ABNORMAL HIGH (ref 3.4–10.8)

## 2021-07-06 LAB — BMP8+EGFR
BUN/Creatinine Ratio: 10 (ref 9–23)
BUN: 10 mg/dL (ref 6–24)
CO2: 21 mmol/L (ref 20–29)
Calcium: 9.5 mg/dL (ref 8.7–10.2)
Chloride: 104 mmol/L (ref 96–106)
Creatinine, Ser: 1.01 mg/dL — ABNORMAL HIGH (ref 0.57–1.00)
Glucose: 91 mg/dL (ref 70–99)
Potassium: 4.4 mmol/L (ref 3.5–5.2)
Sodium: 137 mmol/L (ref 134–144)
eGFR: 68 mL/min/{1.73_m2} (ref >59)

## 2021-07-06 LAB — HM PAP SMEAR
HM Pap smear: ABNORMAL
HM Pap smear: NORMAL

## 2021-07-06 LAB — LIPID PANEL
Chol/HDL Ratio: 5 ratio — ABNORMAL HIGH (ref 0.0–4.4)
Cholesterol, Total: 195 mg/dL (ref 100–199)
HDL: 39 mg/dL — ABNORMAL LOW (ref >39)
LDL Chol Calc (NIH): 116 mg/dL — ABNORMAL HIGH (ref 0–99)
Triglycerides: 227 mg/dL — ABNORMAL HIGH (ref 0–149)
VLDL Cholesterol Cal: 40 mg/dL (ref 5–40)

## 2021-07-06 LAB — HM MAMMOGRAPHY: HM Mammogram: ABNORMAL — AB (ref 0–4)

## 2021-07-06 MED ORDER — HYDROCHLOROTHIAZIDE 12.5 MG PO TABS: 12.5000 mg | ORAL_TABLET | Freq: Every day | ORAL | 2 refills | Status: DC

## 2021-07-06 NOTE — Progress Notes (Signed)
I,Tianna Badgett,acting as a Education administrator for [employer], FNP.,have documented all relevant documentation on the behalf of Minette Brine, FNP,as directed by  Minette Brine, FNP while in the presence of Minette Brine, [place].  This visit occurred during the SARS-CoV-2 public health emergency.  Safety protocols were in place, including screening questions prior to the visit, additional usage of staff PPE, and extensive cleaning of exam room while observing appropriate contact time as indicated for disinfecting solutions.  Subjective:     Patient ID: Martha [name] , female    DOB: 06-23-1970 , 51 y.o.   MRN: [id number]   Chief Complaint  Patient presents with   Hypertension    HPI  Patient is here today for elevated blood pressure readings. She reports reading on Monday 180/120. Patient denies headache, dizziness and nausea. Denies having any blood pressure problems in the past. She had a sleep study done but did not get fitted for a CPAP. She does have a good amount of stress in her life. She is on a z-pack for oral surgery    Past Medical History:  Diagnosis Date   Infertility associated with anovulation      Family History  Problem Relation Age of Onset   Hypertension Father    Diabetes Father    Breast cancer Mother 54   Ovarian cancer Maternal Grandmother      Current Outpatient Medications:    hydrochlorothiazide (HYDRODIURIL) 12.5 MG tablet, Take 1 tablet (12.5 mg total) by mouth daily., Disp: 30 tablet, Rfl: 2   Cholecalciferol (VITAMIN D) 50 MCG (2000 UT) tablet, Take 2,000 Units by mouth daily., Disp: , Rfl:    COLLAGEN PO, Take by mouth., Disp: , Rfl:    Glucosamine-Chondroitin-MSM [id number] MG TABS, Take by mouth., Disp: , Rfl:    levonorgestrel (MIRENA) 20 MCG/24HR IUD, 1 each by Intrauterine route once., Disp: , Rfl:    POTASSIUM PO, Take by mouth daily. , Disp: , Rfl:    No Known Allergies   Review of Systems  HENT:         She has a swollen lymph node left  posterior neck. She has allergy symptoms.   Respiratory: Negative.  Negative for cough and wheezing.   Cardiovascular:  Positive for leg swelling.  Neurological:  Negative for dizziness.  Psychiatric/Behavioral: Negative.      Today's Vitals   07/06/21 0954  BP: 138/88  Pulse: 67  Temp: 97.9 F (36.6 C)  TempSrc: Oral  Weight: 231 lb 9.6 oz (105.1 kg)  Height: 5' 8.6" (1.742 m)   Body mass index is 34.6 kg/m.  Wt Readings from Last 3 Encounters:  07/06/21 231 lb 9.6 oz (105.1 kg)  03/19/19 234 lb (106.1 kg)  02/27/19 232 lb 12.8 oz (105.6 kg)    Objective:  Physical Exam Vitals reviewed.  Constitutional:      General: She is not in acute distress.    Appearance: Normal appearance. She is well-developed. She is obese.  HENT:     Head: Normocephalic and atraumatic.  Eyes:     Pupils: Pupils are equal, round, and reactive to light.  Cardiovascular:     Rate and Rhythm: Normal rate and regular rhythm.     Pulses: Normal pulses.     Heart sounds: Normal heart sounds. No murmur heard. Pulmonary:     Effort: Pulmonary effort is normal.     Breath sounds: Normal breath sounds. No wheezing.  Musculoskeletal:        General: Normal range  of motion.     Right lower leg: Edema (trace) present.     Left lower leg: Edema (trace) present.  Lymphadenopathy:     Cervical: Cervical adenopathy present.     Right cervical: No posterior cervical adenopathy.    Left cervical: Posterior cervical adenopathy (tender) present.  Skin:    General: Skin is warm and dry.     Capillary Refill: Capillary refill takes less than 2 seconds.  Neurological:     General: No focal deficit present.     Mental Status: She is alert and oriented to person, place, and time.     Cranial Nerves: No cranial nerve deficit.     Motor: No weakness.  Psychiatric:        Mood and Affect: Mood normal.        Behavior: Behavior normal.        Thought Content: Thought content normal.        Judgment: Judgment  normal.        Assessment And Plan:     1. Blood pressure elevated without history of HTN Comments: Blood pressure has been elevated, will start on HCTZ. She is under increased stress. ECG done with NSR HR 65 - hydrochlorothiazide (HYDRODIURIL) 12.5 MG tablet; Take 1 tablet (12.5 mg total) by mouth daily.  Dispense: 30 tablet; Refill: 2 - EKG 12-Lead  2. Swollen lymph nodes Comments: Firm non-mobile lymph node to posterior cervical chain, slight tenderness. Will check CBC with diff and consider ultrasound.  - BMP8+eGFR - CBC with Diff  3. 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 Advised to aim for at least 150 minutes of exercise per week.  4. Encounter for screening for metabolic disorder - Lipid panel     Patient was given opportunity to ask questions. Patient verbalized understanding of the plan and was able to repeat key elements of the plan. All questions were answered to their satisfaction.  Minette Brine, FNP   I, Minette Brine, FNP, have reviewed all documentation for this visit. The documentation on 07/06/21 for the exam, diagnosis, procedures, and orders are all accurate and complete.   IF YOU HAVE BEEN REFERRED TO A SPECIALIST, IT MAY TAKE 1-2 WEEKS TO SCHEDULE/PROCESS THE REFERRAL. IF YOU HAVE NOT HEARD FROM US/SPECIALIST IN TWO WEEKS, PLEASE GIVE [place] A CALL AT [phone].   THE PATIENT IS ENCOURAGED TO PRACTICE SOCIAL DISTANCING DUE TO THE COVID-19 PANDEMIC.

## 2021-07-06 NOTE — Patient Instructions (Signed)

## 2021-07-07 ENCOUNTER — Other Ambulatory Visit: Payer: Self-pay | Admitting: Nurse Practitioner

## 2021-07-07 MED ORDER — AMOXICILLIN-POT CLAVULANATE 875-125 MG PO TABS: 1.0000 | ORAL_TABLET | Freq: Two times a day (BID) | ORAL | 0 refills | Status: DC

## 2021-07-08 ENCOUNTER — Other Ambulatory Visit: Payer: Self-pay

## 2021-07-08 MED ORDER — FLUCONAZOLE 150 MG PO TABS: ORAL_TABLET | ORAL | 0 refills | Status: DC

## 2021-07-11 ENCOUNTER — Other Ambulatory Visit: Payer: Self-pay | Admitting: Nurse Practitioner

## 2021-07-11 NOTE — Progress Notes (Signed)
Okay yes I did forget, we can see if the zpack helps. Diflucan is already sent

## 2021-07-12 ENCOUNTER — Other Ambulatory Visit: Payer: Self-pay

## 2021-08-03 ENCOUNTER — Ambulatory Visit: Payer: 59 | Admitting: Internal Medicine

## 2021-08-10 ENCOUNTER — Ambulatory Visit (INDEPENDENT_AMBULATORY_CARE_PROVIDER_SITE_OTHER): Payer: 59 | Admitting: Internal Medicine

## 2021-08-10 ENCOUNTER — Other Ambulatory Visit: Payer: Self-pay

## 2021-08-10 ENCOUNTER — Encounter: Payer: Self-pay | Admitting: Internal Medicine

## 2021-08-10 VITALS — BP 144/80 | HR 56 | Temp 98.3°F | Ht 68.6 in | Wt 226.6 lb

## 2021-08-10 DIAGNOSIS — E6609 Other obesity due to excess calories: Secondary | ICD-10-CM | POA: Diagnosis not present

## 2021-08-10 DIAGNOSIS — I10 Essential (primary) hypertension: Secondary | ICD-10-CM | POA: Diagnosis not present

## 2021-08-10 DIAGNOSIS — Z6833 Body mass index (BMI) 33.0-33.9, adult: Secondary | ICD-10-CM | POA: Diagnosis not present

## 2021-08-10 MED ORDER — AMLODIPINE BESYLATE 5 MG PO TABS: 5.0000 mg | ORAL_TABLET | Freq: Every day | ORAL | 11 refills | Status: DC

## 2021-08-10 NOTE — Progress Notes (Signed)
?This visit occurred during the SARS-CoV-2 public health emergency.  Safety protocols were in place, including screening questions prior to the visit, additional usage of staff PPE, and extensive cleaning of exam room while observing appropriate contact time as indicated for disinfecting solutions. ? ?Subjective:  ?  ? Patient ID: Martha [name] , female    DOB: 11-Feb-1971 , 51 y.o.   MRN: [id number] ? ? ?Chief Complaint  ?Patient presents with  ? Hypertension  ? ? ?HPI ? ?She presents today for BP check. She was started on HCTZ 12.50m four weeks ago, after BP found to be markedly elevated at dental visit. She reports compliance with meds. However, states home readings are 150-160/100s even while on the medication. Denies cp, sob and palpitations. Exercises regularly.  ?  ? ?Past Medical History:  ?Diagnosis Date  ? Infertility associated with anovulation   ?  ? ?Family History  ?Problem Relation Age of Onset  ? Hypertension Father   ? Diabetes Father   ? Breast cancer Mother 478 ? Ovarian cancer Maternal Grandmother   ? ? ? ?Current Outpatient Medications:  ?  amLODipine (NORVASC) 5 MG tablet, Take 1 tablet (5 mg total) by mouth daily., Disp: 30 tablet, Rfl: 11 ?  Cholecalciferol (VITAMIN D) 50 MCG (2000 UT) tablet, Take 2,000 Units by mouth daily., Disp: , Rfl:  ?  COLLAGEN PO, Take by mouth., Disp: , Rfl:  ?  Glucosamine-Chondroitin-MSM 500-200-150 MG TABS, Take by mouth., Disp: , Rfl:  ?  hydrochlorothiazide (HYDRODIURIL) 12.5 MG tablet, Take 1 tablet (12.5 mg total) by mouth daily., Disp: 30 tablet, Rfl: 2 ?  levonorgestrel (MIRENA) 20 MCG/24HR IUD, 1 each by Intrauterine route once., Disp: , Rfl:  ?  POTASSIUM PO, Take by mouth daily. , Disp: , Rfl:   ? ?No Known Allergies  ? ?Review of Systems  ?Constitutional: Negative.   ?Respiratory: Negative.    ?Cardiovascular: Negative.   ?Gastrointestinal: Negative.   ?Neurological: Negative.   ?Psychiatric/Behavioral: Negative.     ? ?Today's Vitals  ? 08/10/21 1050   ?BP: (!) 144/80  ?Pulse: (!) 56  ?Temp: 98.3 ?F (36.8 ?C)  ?Weight: 226 lb 9.6 oz (102.8 kg)  ?Height: 5' 8.6" (1.742 m)  ?PainSc: 0-No pain  ? ?Body mass index is 33.85 kg/m?.  ? ?Wt Readings from Last 3 Encounters:  ?08/10/21 226 lb 9.6 oz (102.8 kg)  ?07/06/21 231 lb 9.6 oz (105.1 kg)  ?03/19/19 234 lb (106.1 kg)  ?  ? ?BP Readings from Last 3 Encounters:  ?08/10/21 (!) 144/80  ?07/06/21 138/88  ?03/19/19 (!) 147/98  ?  ? ?Objective:  ?Physical Exam ?Vitals and nursing note reviewed.  ?Constitutional:   ?   Appearance: Normal appearance.  ?HENT:  ?   Head: Normocephalic and atraumatic.  ?   Nose:  ?   Comments: Masked  ?   Mouth/Throat:  ?   Comments: Masked  ?Eyes:  ?   Extraocular Movements: Extraocular movements intact.  ?Cardiovascular:  ?   Rate and Rhythm: Normal rate and regular rhythm.  ?   Heart sounds: Normal heart sounds.  ?Pulmonary:  ?   Effort: Pulmonary effort is normal.  ?   Breath sounds: Normal breath sounds.  ?Musculoskeletal:  ?   Cervical back: Normal range of motion.  ?Skin: ?   General: Skin is warm.  ?Neurological:  ?   General: No focal deficit present.  ?   Mental Status: She is alert.  ?Psychiatric:     ?  Mood and Affect: Mood normal.     ?   Behavior: Behavior normal.  ?    ?Assessment And Plan:  ?   ?1. Essential hypertension, benign ?Comments: Newly diagnosed. I will add amlodipine 48m nightly. She agrees to rto in 6 wks. I will check BMP today due to diuretic use.  ?- BMP8+eGFR ? ?2. Class 1 obesity due to excess calories with serious comorbidity and body mass index (BMI) of 33.0 to 33.9 in adult ?Comments: She was congratulated on her 5 lb weight loss since Feb 2023. She is encouraged to strive for BMI<30 to decrease cardiac risk. ? ?Patient was given opportunity to ask questions. Patient verbalized understanding of the plan and was able to repeat key elements of the plan. All questions were answered to their satisfaction.  ? ?I, RMaximino Greenland MD, have reviewed all  documentation for this visit. The documentation on 08/10/21 for the exam, diagnosis, procedures, and orders are all accurate and complete.  ? ?IF YOU HAVE BEEN REFERRED TO A SPECIALIST, IT MAY TAKE 1-2 WEEKS TO SCHEDULE/PROCESS THE REFERRAL. IF YOU HAVE NOT HEARD FROM US/SPECIALIST IN TWO WEEKS, PLEASE GIVE [place] CALL AT [phone].  ? ?THE PATIENT IS ENCOURAGED TO PRACTICE SOCIAL DISTANCING DUE TO THE COVID-19 PANDEMIC.   ?

## 2021-08-10 NOTE — Patient Instructions (Signed)

## 2021-08-11 LAB — BMP8+EGFR
BUN/Creatinine Ratio: 12 (ref 9–23)
BUN: 13 mg/dL (ref 6–24)
CO2: 22 mmol/L (ref 20–29)
Calcium: 9.9 mg/dL (ref 8.7–10.2)
Chloride: 105 mmol/L (ref 96–106)
Creatinine, Ser: 1.07 mg/dL — ABNORMAL HIGH (ref 0.57–1.00)
Glucose: 92 mg/dL (ref 70–99)
Potassium: 4.3 mmol/L (ref 3.5–5.2)
Sodium: 142 mmol/L (ref 134–144)
eGFR: 64 mL/min/{1.73_m2} (ref >59)

## 2021-08-16 LAB — HM MAMMOGRAPHY

## 2021-09-29 ENCOUNTER — Encounter: Payer: Self-pay | Admitting: Internal Medicine

## 2021-09-29 ENCOUNTER — Ambulatory Visit (INDEPENDENT_AMBULATORY_CARE_PROVIDER_SITE_OTHER): Payer: 59 | Admitting: Internal Medicine

## 2021-09-29 VITALS — BP 108/80 | HR 64 | Temp 98.1°F | Ht 68.6 in

## 2021-09-29 DIAGNOSIS — I10 Essential (primary) hypertension: Secondary | ICD-10-CM | POA: Diagnosis not present

## 2021-09-29 DIAGNOSIS — Z1159 Encounter for screening for other viral diseases: Secondary | ICD-10-CM

## 2021-09-29 DIAGNOSIS — Z6833 Body mass index (BMI) 33.0-33.9, adult: Secondary | ICD-10-CM

## 2021-09-29 DIAGNOSIS — R030 Elevated blood-pressure reading, without diagnosis of hypertension: Secondary | ICD-10-CM

## 2021-09-29 DIAGNOSIS — Z23 Encounter for immunization: Secondary | ICD-10-CM

## 2021-09-29 DIAGNOSIS — E6609 Other obesity due to excess calories: Secondary | ICD-10-CM | POA: Diagnosis not present

## 2021-09-29 NOTE — Patient Instructions (Signed)
Hypertension, Adult ?Hypertension is another name for high blood pressure. High blood pressure forces your heart to work harder to pump blood. This can cause problems over time. ?There are two numbers in a blood pressure reading. There is a top number (systolic) over a bottom number (diastolic). It is best to have a blood pressure that is below 120/80. ?What are the causes? ?The cause of this condition is not known. Some other conditions can lead to high blood pressure. ?What increases the risk? ?Some lifestyle factors can make you more likely to develop high blood pressure: ?Smoking. ?Not getting enough exercise or physical activity. ?Being overweight. ?Having too much fat, sugar, calories, or salt (sodium) in your diet. ?Drinking too much alcohol. ?Other risk factors include: ?Having any of these conditions: ?Heart disease. ?Diabetes. ?High cholesterol. ?Kidney disease. ?Obstructive sleep apnea. ?Having a family history of high blood pressure and high cholesterol. ?Age. The risk increases with age. ?Stress. ?What are the signs or symptoms? ?High blood pressure may not cause symptoms. Very high blood pressure (hypertensive crisis) may cause: ?Headache. ?Fast or uneven heartbeats (palpitations). ?Shortness of breath. ?Nosebleed. ?Vomiting or feeling like you may vomit (nauseous). ?Changes in how you see. ?Very bad chest pain. ?Feeling dizzy. ?Seizures. ?How is this treated? ?This condition is treated by making healthy lifestyle changes, such as: ?Eating healthy foods. ?Exercising more. ?Drinking less alcohol. ?Your doctor may prescribe medicine if lifestyle changes do not help enough and if: ?Your top number is above 130. ?Your bottom number is above 80. ?Your personal target blood pressure may vary. ?Follow these instructions at home: ?Eating and drinking ? ?If told, follow the DASH eating plan. To follow this plan: ?Fill one half of your plate at each meal with fruits and vegetables. ?Fill one fourth of your plate  at each meal with whole grains. Whole grains include whole-wheat pasta, brown rice, and whole-grain bread. ?Eat or drink low-fat dairy products, such as skim milk or low-fat yogurt. ?Fill one fourth of your plate at each meal with low-fat (lean) proteins. Low-fat proteins include fish, chicken without skin, eggs, beans, and tofu. ?Avoid fatty meat, cured and processed meat, or chicken with skin. ?Avoid pre-made or processed food. ?Limit the amount of salt in your diet to less than 1,500 mg each day. ?Do not drink alcohol if: ?Your doctor tells you not to drink. ?You are pregnant, may be pregnant, or are planning to become pregnant. ?If you drink alcohol: ?Limit how much you have to: ?0-1 drink a day for women. ?0-2 drinks a day for men. ?Know how much alcohol is in your drink. In the [place] one drink equals one 12 oz bottle of beer (355 mL), one 5 oz glass of wine (148 mL), or one 1? oz glass of hard liquor (44 mL). ?Lifestyle ? ?Work with your doctor to stay at a healthy weight or to lose weight. Ask your doctor what the best weight is for you. ?Get at least 30 minutes of exercise that causes your heart to beat faster (aerobic exercise) most days of the week. This may include walking, swimming, or biking. ?Get at least 30 minutes of exercise that strengthens your muscles (resistance exercise) at least 3 days a week. This may include lifting weights or doing Pilates. ?Do not smoke or use any products that contain nicotine or tobacco. If you need help quitting, ask your doctor. ?Check your blood pressure at home as told by your doctor. ?Keep all follow-up visits. ?Medicines ?Take over-the-counter and prescription medicines   only as told by your doctor. Follow directions carefully. ?Do not skip doses of blood pressure medicine. The medicine does not work as well if you skip doses. Skipping doses also puts you at risk for problems. ?Ask your doctor about side effects or reactions to medicines that you should watch  for. ?Contact a doctor if: ?You think you are having a reaction to the medicine you are taking. ?You have headaches that keep coming back. ?You feel dizzy. ?You have swelling in your ankles. ?You have trouble with your vision. ?Get help right away if: ?You get a very bad headache. ?You start to feel mixed up (confused). ?You feel weak or numb. ?You feel faint. ?You have very bad pain in your: ?Chest. ?Belly (abdomen). ?You vomit more than once. ?You have trouble breathing. ?These symptoms may be an emergency. Get help right away. Call 911. ?Do not wait to see if the symptoms will go away. ?Do not drive yourself to the hospital. ?Summary ?Hypertension is another name for high blood pressure. ?High blood pressure forces your heart to work harder to pump blood. ?For most people, a normal blood pressure is less than 120/80. ?Making healthy choices can help lower blood pressure. If your blood pressure does not get lower with healthy choices, you may need to take medicine. ?This information is not intended to replace advice given to you by your health care provider. Make sure you discuss any questions you have with your health care provider. ?Document Revised: 02/24/2021 Document Reviewed: 02/24/2021 ?Elsevier Patient Education ? 2023 Elsevier Inc. ? ?

## 2021-09-29 NOTE — Progress Notes (Signed)
?I,Victoria T Hamilton,acting as a scribe for Maximino Greenland, MD.,have documented all relevant documentation on the behalf of Maximino Greenland, MD,as directed by  Maximino Greenland, MD while in the presence of Maximino Greenland, MD.  ?This visit occurred during the SARS-CoV-2 public health emergency.  Safety protocols were in place, including screening questions prior to the visit, additional usage of staff PPE, and extensive cleaning of exam room while observing appropriate contact time as indicated for disinfecting solutions. ? ?Subjective:  ?  ? Patient ID: Martha [name] , female    DOB: [birth year] , 51 y.o.   MRN: [id number] ? ? ?Chief Complaint  ?Patient presents with  ? Hypertension  ? ? ?HPI ? ?Pt presents today for BPC. She reports compliance with meds. She has been taking amlodipine '5mg'$  and hctz '25mg'$ . She has been having some muscle cramps. She started both potassium and magnesium supplementation.  She denies headaches, chest pain and shortness of breath.  ? ?Hypertension ?This is a chronic problem. The current episode started more than 1 year ago. The problem has been gradually improving since onset. The problem is controlled. Pertinent negatives include no blurred vision or chest pain.   ? ?Past Medical History:  ?Diagnosis Date  ? Infertility associated with anovulation   ?  ? ?Family History  ?Problem Relation Age of Onset  ? Hypertension Father   ? Diabetes Father   ? Breast cancer Mother 54  ? Ovarian cancer Maternal Grandmother   ? ? ? ?Current Outpatient Medications:  ?  amLODipine (NORVASC) 5 MG tablet, Take 1 tablet (5 mg total) by mouth daily., Disp: 30 tablet, Rfl: 11 ?  Cholecalciferol (VITAMIN D) 50 MCG (2000 UT) tablet, Take 2,000 Units by mouth daily., Disp: , Rfl:  ?  COLLAGEN PO, Take by mouth., Disp: , Rfl:  ?  Glucosamine-Chondroitin-MSM 500-200-150 MG TABS, Take by mouth., Disp: , Rfl:  ?  hydrochlorothiazide (HYDRODIURIL) 12.5 MG tablet, Take 1 tablet (12.5 mg total) by mouth daily.,  Disp: 30 tablet, Rfl: 2 ?  levonorgestrel (MIRENA) 20 MCG/24HR IUD, 1 each by Intrauterine route once., Disp: , Rfl:  ?  MAGNESIUM OXIDE PO, Take 250 mg by mouth daily at 6 (six) AM., Disp: , Rfl:  ?  POTASSIUM PO, Take by mouth daily. , Disp: , Rfl:   ? ?No Known Allergies  ? ?Review of Systems  ?Constitutional: Negative.   ?Eyes:  Negative for blurred vision.  ?Respiratory: Negative.    ?Cardiovascular: Negative.  Negative for chest pain.  ?Neurological: Negative.   ?Psychiatric/Behavioral: Negative.     ? ?Today's Vitals  ? 09/29/21 1018  ?BP: 108/80  ?Pulse: 64  ?Temp: 98.1 ?F (36.7 ?C)  ?Height: 5' 8.6" (1.742 m)  ? ?Body mass index is 33.85 kg/m?.  ?Wt Readings from Last 3 Encounters:  ?08/10/21 226 lb 9.6 oz (102.8 kg)  ?07/06/21 231 lb 9.6 oz (105.1 kg)  ?03/19/19 234 lb (106.1 kg)  ?  ? ?Objective:  ?Physical Exam ?Vitals and nursing note reviewed.  ?Constitutional:   ?   Appearance: Normal appearance.  ?HENT:  ?   Head: Normocephalic and atraumatic.  ?Eyes:  ?   Extraocular Movements: Extraocular movements intact.  ?Cardiovascular:  ?   Rate and Rhythm: Normal rate and regular rhythm.  ?   Heart sounds: Normal heart sounds.  ?Pulmonary:  ?   Effort: Pulmonary effort is normal.  ?   Breath sounds: Normal breath sounds.  ?Skin: ?   General:  Skin is warm.  ?Neurological:  ?   General: No focal deficit present.  ?   Mental Status: She is alert.  ?Psychiatric:     ?   Mood and Affect: Mood normal.     ?   Behavior: Behavior normal.  ?    ?Assessment And Plan:  ?   ?1. Essential hypertension, benign ?Comments: Well controlled, will decrease hctz to one tab daily due to muscle cramps. She agrees to monitor her BP weekly at work.  ? ?2. Class 1 obesity due to excess calories with serious comorbidity and body mass index (BMI) of 33.0 to 33.9 in adult ?Comments: She is encouraged to strive for BMI<30 to decrease cardiac risk while aiming for at least 150 minutes of exercise per week.  ? ?3. Immunization due ?- Tdap  vaccine greater than or equal to 7yo IM ?  ?Patient was given opportunity to ask questions. Patient verbalized understanding of the plan and was able to repeat key elements of the plan. All questions were answered to their satisfaction.  ? ?I, Maximino Greenland, MD, have reviewed all documentation for this visit. The documentation on 09/29/21 for the exam, diagnosis, procedures, and orders are all accurate and complete.  ? ?IF YOU HAVE BEEN REFERRED TO A SPECIALIST, IT MAY TAKE 1-2 WEEKS TO SCHEDULE/PROCESS THE REFERRAL. IF YOU HAVE NOT HEARD FROM US/SPECIALIST IN TWO WEEKS, PLEASE GIVE [place] A CALL AT [phone].  ? ?THE PATIENT IS ENCOURAGED TO PRACTICE SOCIAL DISTANCING DUE TO THE COVID-19 PANDEMIC.   ?

## 2021-10-01 ENCOUNTER — Other Ambulatory Visit: Payer: Self-pay | Admitting: Nurse Practitioner

## 2021-10-01 ENCOUNTER — Encounter: Payer: Self-pay | Admitting: Internal Medicine

## 2021-10-01 DIAGNOSIS — R030 Elevated blood-pressure reading, without diagnosis of hypertension: Secondary | ICD-10-CM

## 2021-10-02 DIAGNOSIS — I10 Essential (primary) hypertension: Secondary | ICD-10-CM | POA: Insufficient documentation

## 2021-11-23 ENCOUNTER — Encounter: Payer: 59 | Admitting: Internal Medicine

## 2021-11-23 NOTE — Progress Notes (Deleted)
Rich Brave Llittleton,acting as a Education administrator for Maximino Greenland, MD.,have documented all relevant documentation on the behalf of Maximino Greenland, MD,as directed by  Maximino Greenland, MD while in the presence of Maximino Greenland, MD.  This visit occurred during the SARS-CoV-2 public health emergency.  Safety protocols were in place, including screening questions prior to the visit, additional usage of staff PPE, and extensive cleaning of exam room while observing appropriate contact time as indicated for disinfecting solutions.  Subjective:     Patient ID: Martha [name] , female    DOB: August 24, 1970 , 51 y.o.   MRN: [id number]   Chief Complaint  Patient presents with   Annual Exam    HPI  Patient presents today for a physical. Patient is followed by      Past Medical History:  Diagnosis Date   Infertility associated with anovulation      Family History  Problem Relation Age of Onset   Hypertension Father    Diabetes Father    Breast cancer Mother 10   Ovarian cancer Maternal Grandmother      Current Outpatient Medications:    amLODipine (NORVASC) 5 MG tablet, Take 1 tablet (5 mg total) by mouth daily., Disp: 30 tablet, Rfl: 11   Cholecalciferol (VITAMIN D) 50 MCG (2000 UT) tablet, Take 2,000 Units by mouth daily., Disp: , Rfl:    COLLAGEN PO, Take by mouth., Disp: , Rfl:    Glucosamine-Chondroitin-MSM 500-200-150 MG TABS, Take by mouth., Disp: , Rfl:    hydrochlorothiazide (HYDRODIURIL) 12.5 MG tablet, TAKE 1 TABLET BY MOUTH EVERY DAY, Disp: 90 tablet, Rfl: 1   levonorgestrel (MIRENA) 20 MCG/24HR IUD, 1 each by Intrauterine route once., Disp: , Rfl:    MAGNESIUM OXIDE PO, Take 250 mg by mouth daily at 6 (six) AM., Disp: , Rfl:    POTASSIUM PO, Take by mouth daily. , Disp: , Rfl:    No Known Allergies    The patient states she uses {contraceptive methods:5051} for birth control. Last LMP was No LMP recorded. (Menstrual status: IUD).. {Dysmenorrhea-menorrhagia:21918}. Negative  for: breast discharge, breast lump(s), breast pain and breast self exam. Associated symptoms include abnormal vaginal bleeding. Pertinent negatives include abnormal bleeding (hematology), anxiety, decreased libido, depression, difficulty falling sleep, dyspareunia, history of infertility, nocturia, sexual dysfunction, sleep disturbances, urinary incontinence, urinary urgency, vaginal discharge and vaginal itching. Diet regular.The patient states her exercise level is    . The patient's tobacco use is:  Social History   Tobacco Use  Smoking Status Never  Smokeless Tobacco Never  . She has been exposed to passive smoke. The patient's alcohol use is:  Social History   Substance and Sexual Activity  Alcohol Use Yes   Alcohol/week: 2.0 standard drinks of alcohol   Types: 1 Glasses of wine, 1 Shots of liquor per week   Comment: pt drinks occasionally  . Additional information: Last pap ***, next one scheduled for ***.    Review of Systems  Constitutional: Negative.   HENT: Negative.    Eyes: Negative.   Respiratory: Negative.    Cardiovascular: Negative.   Gastrointestinal: Negative.   Endocrine: Negative.   Genitourinary: Negative.   Musculoskeletal: Negative.   Skin: Negative.   Neurological: Negative.   Hematological: Negative.   Psychiatric/Behavioral: Negative.       There were no vitals filed for this visit. There is no height or weight on file to calculate BMI.   Objective:  Physical Exam  Assessment And Plan:     1. Encounter for general adult medical examination w/o abnormal findings  2. Essential hypertension, benign     Patient was given opportunity to ask questions. Patient verbalized understanding of the plan and was able to repeat key elements of the plan. All questions were answered to their satisfaction.   Sheppard Evens Ll[place]leton, CMA   I, [place], CMA, have reviewed all documentation for this visit. The documentation on 11/23/21 for the  exam, diagnosis, procedures, and orders are all accurate and complete.  THE PATIENT IS ENCOURAGED TO PRACTICE SOCIAL DISTANCING DUE TO THE COVID-19 PANDEMIC.

## 2021-11-27 NOTE — Progress Notes (Signed)
No show

## 2022-04-05 ENCOUNTER — Encounter: Payer: Self-pay | Admitting: Internal Medicine

## 2022-04-05 ENCOUNTER — Ambulatory Visit (INDEPENDENT_AMBULATORY_CARE_PROVIDER_SITE_OTHER): Payer: 59 | Admitting: Internal Medicine

## 2022-04-05 VITALS — BP 122/80 | HR 70 | Temp 98.0°F | Ht 67.2 in | Wt 234.6 lb

## 2022-04-05 DIAGNOSIS — I10 Essential (primary) hypertension: Secondary | ICD-10-CM

## 2022-04-05 DIAGNOSIS — Z0000 Encounter for general adult medical examination without abnormal findings: Secondary | ICD-10-CM

## 2022-04-05 DIAGNOSIS — Z23 Encounter for immunization: Secondary | ICD-10-CM | POA: Diagnosis not present

## 2022-04-05 DIAGNOSIS — N6342 Unspecified lump in left breast, subareolar: Secondary | ICD-10-CM

## 2022-04-05 DIAGNOSIS — Z6836 Body mass index (BMI) 36.0-36.9, adult: Secondary | ICD-10-CM

## 2022-04-05 DIAGNOSIS — E559 Vitamin D deficiency, unspecified: Secondary | ICD-10-CM | POA: Diagnosis not present

## 2022-04-05 LAB — POCT URINALYSIS DIPSTICK
Bilirubin, UA: NEGATIVE
Blood, UA: NEGATIVE
Glucose, UA: NEGATIVE
Ketones, UA: NEGATIVE
Leukocytes, UA: NEGATIVE
Nitrite, UA: NEGATIVE
Protein, UA: NEGATIVE
Spec Grav, UA: 1.025 (ref 1.010–1.025)
Urobilinogen, UA: 0.2 E.U./dL
pH, UA: 5.5 (ref 5.0–8.0)

## 2022-04-05 NOTE — Patient Instructions (Signed)

## 2022-04-05 NOTE — Progress Notes (Signed)
Rich Brave [place],acting as a Education administrator for Maximino Greenland, MD.,have documented all relevant documentation on the behalf of Maximino Greenland, MD,as directed by  Maximino Greenland, MD while in the presence of Maximino Greenland, MD.   Subjective:     Patient ID: Martha [name] , female    DOB: 28-Mar-1971 , 51 y.o.   MRN: [id number]   Chief Complaint  Patient presents with   Annual Exam   Hypertension    HPI  Patient presents today for HM, Patient reports compliance with medications and has no other concerns today.  She is followed by Gyn for her pelvic exams.   Hypertension This is a chronic problem. The current episode started more than 1 year ago. The problem has been gradually improving since onset. The problem is controlled. Pertinent negatives include no blurred vision or chest pain. Past treatments include calcium channel blockers. The current treatment provides moderate improvement. There are no compliance problems.      Past Medical History:  Diagnosis Date   Infertility associated with anovulation      Family History  Problem Relation Age of Onset   Hypertension Father    Diabetes Father    Breast cancer Mother 66   Ovarian cancer Maternal Grandmother      Current Outpatient Medications:    amLODipine (NORVASC) 5 MG tablet, Take 1 tablet (5 mg total) by mouth daily., Disp: 30 tablet, Rfl: 11   Cholecalciferol (VITAMIN D) 50 MCG (2000 UT) tablet, Take 2,000 Units by mouth daily., Disp: , Rfl:    COLLAGEN PO, Take by mouth., Disp: , Rfl:    cyclobenzaprine (FLEXERIL) 10 MG tablet, Take 1 tablet by mouth every 8 (eight) hours as needed., Disp: , Rfl:    gabapentin (NEURONTIN) 300 MG capsule, TAKE 1 CAPSULE BY MOUTH THREE TIMES A DAY, Disp: , Rfl:    Glucosamine-Chondroitin-MSM 500-200-150 MG TABS, Take by mouth., Disp: , Rfl:    hydrochlorothiazide (HYDRODIURIL) 12.5 MG tablet, TAKE 1 TABLET BY MOUTH EVERY DAY, Disp: 90 tablet, Rfl: 1   levonorgestrel (MIRENA) 20 MCG/24HR  IUD, 1 each by Intrauterine route once., Disp: , Rfl:    MAGNESIUM OXIDE PO, Take 250 mg by mouth daily at 6 (six) AM., Disp: , Rfl:    POTASSIUM PO, Take by mouth daily. , Disp: , Rfl:    No Known Allergies    The patient states she uses IUD for birth control. Last LMP was No LMP recorded. (Menstrual status: IUD).. Negative for Dysmenorrhea. Negative for: breast discharge, breast lump(s), breast pain and breast self exam. Associated symptoms include abnormal vaginal bleeding. Pertinent negatives include abnormal bleeding (hematology), anxiety, decreased libido, depression, difficulty falling sleep, dyspareunia, history of infertility, nocturia, sexual dysfunction, sleep disturbances, urinary incontinence, urinary urgency, vaginal discharge and vaginal itching. Diet regular.    . The patient's tobacco use is:  Social History   Tobacco Use  Smoking Status Never  Smokeless Tobacco Never  . She has been exposed to passive smoke. The patient's alcohol use is:  Social History   Substance and Sexual Activity  Alcohol Use Yes   Alcohol/week: 2.0 standard drinks of alcohol   Types: 1 Glasses of wine, 1 Shots of liquor per week   Comment: pt drinks occasionally   Review of Systems  Constitutional: Negative.   HENT: Negative.    Eyes: Negative.  Negative for blurred vision.  Respiratory: Negative.    Cardiovascular: Negative.  Negative for chest pain.  Gastrointestinal: Negative.  Endocrine: Negative.   Genitourinary: Negative.   Musculoskeletal: Negative.   Skin: Negative.   Allergic/Immunologic: Negative.   Neurological: Negative.   Hematological: Negative.      Today's Vitals   04/05/22 1126  BP: 122/80  Pulse: 70  Temp: 98 F (36.7 C)  Weight: 234 lb 9.6 oz (106.4 kg)  Height: 5' 7.2" (1.707 m)  PainSc: 5   PainLoc: Shoulder   Body mass index is 36.53 kg/m.  Wt Readings from Last 3 Encounters:  04/05/22 234 lb 9.6 oz (106.4 kg)  08/10/21 226 lb 9.6 oz (102.8 kg)   07/06/21 231 lb 9.6 oz (105.1 kg)     Objective:  Physical Exam Vitals and nursing note reviewed.  Constitutional:      Appearance: Normal appearance.  HENT:     Head: Normocephalic and atraumatic.     Right Ear: Tympanic membrane, ear canal and external ear normal.     Left Ear: Tympanic membrane, ear canal and external ear normal.     Nose:     Comments: Masked     Mouth/Throat:     Comments: Masked  Eyes:     Extraocular Movements: Extraocular movements intact.     Conjunctiva/sclera: Conjunctivae normal.     Pupils: Pupils are equal, round, and reactive to light.  Cardiovascular:     Rate and Rhythm: Normal rate and regular rhythm.     Pulses: Normal pulses.     Heart sounds: Normal heart sounds.  Pulmonary:     Effort: Pulmonary effort is normal.     Breath sounds: Normal breath sounds.  Chest:  Breasts:    Tanner Score is 5.     Right: Normal.     Left: Mass present.     Comments: About 1cm subareolar mass in left breast, about 3o'clock. Non-tender to palpation Abdominal:     General: Bowel sounds are normal.     Palpations: Abdomen is soft.  Genitourinary:    Comments: deferred Musculoskeletal:        General: Normal range of motion.     Cervical back: Normal range of motion and neck supple.  Lymphadenopathy:     Upper Body:     Right upper body: No axillary adenopathy.     Left upper body: No supraclavicular or axillary adenopathy.  Skin:    General: Skin is warm and dry.  Neurological:     General: No focal deficit present.     Mental Status: She is alert and oriented to person, place, and time.  Psychiatric:        Mood and Affect: Mood normal.        Behavior: Behavior normal.       Assessment And Plan:     1. Annual physical exam Comments: A full exam was performed. Importance of monthly self breast exams was discussed with the patient.  PATIENT IS ADVISED TO GET 30-45 MINUTES REGULAR EXERCISE NO LESS THAN FOUR TO FIVE DAYS PER WEEK - BOTH  WEIGHTBEARING EXERCISES AND AEROBIC ARE RECOMMENDED.  PATIENT IS ADVISED TO FOLLOW A HEALTHY DIET WITH AT LEAST SIX FRUITS/VEGGIES PER DAY, DECREASE INTAKE OF RED MEAT, AND TO INCREASE FISH INTAKE TO TWO DAYS PER WEEK.  MEATS/FISH SHOULD NOT BE FRIED, BAKED OR BROILED IS PREFERABLE.  IT IS ALSO IMPORTANT TO CUT BACK ON YOUR SUGAR INTAKE. PLEASE AVOID ANYTHING WITH ADDED SUGAR, CORN SYRUP OR OTHER SWEETENERS. IF YOU MUST USE A SWEETENER, YOU CAN TRY STEVIA. IT IS ALSO IMPORTANT TO  AVOID ARTIFICIALLY SWEETENERS AND DIET BEVERAGES. LASTLY, I SUGGEST WEARING SPF 50 SUNSCREEN ON EXPOSED PARTS AND ESPECIALLY WHEN IN THE DIRECT SUNLIGHT FOR AN EXTENDED PERIOD OF TIME.  PLEASE AVOID FAST FOOD RESTAURANTS AND INCREASE YOUR WATER INTAKE. - CBC - CMP14+EGFR - Lipid panel - Hemoglobin A1c - TSH  2. Essential hypertension, benign Comments: Chronic, well controlled. EKG performed, NSR w/o acute changes.  She will c/w amlodipine 60m and hctz 12.561mdaily. She is advised to follow a low sodium diet. She will f/u in 6 months. - EKG 12-Lead - POCT Urinalysis Dipstick (81002) - Microalbumin / Creatinine Urine Ratio  3. Subareolar mass of left breast Comments: She reports GYn performed diagnostic mammo and u/s. I will request her records.  4. Vitamin D deficiency Comments: I will check a vitamin D level and supplement as needed. - Vitamin D (25 hydroxy)  5. Class 2 severe obesity due to excess calories with serious comorbidity and body mass index (BMI) of 36.0 to 36.9 in adult [facility] She is encouraged to aim for at least 150 minutes of exercise per week, while initially striving for BMI<30 to decrease cardiac risk.  6. Immunization due - Varicella-zoster vaccine IM  Patient was given opportunity to ask questions. Patient verbalized understanding of the plan and was able to repeat key elements of the plan. All questions were answered to their satisfaction.   I, RoMaximino GreenlandMD, have reviewed all  documentation for this visit. The documentation on 04/05/22 for the exam, diagnosis, procedures, and orders are all accurate and complete.   THE PATIENT IS ENCOURAGED TO PRACTICE SOCIAL DISTANCING DUE TO THE COVID-19 PANDEMIC.

## 2022-04-06 LAB — MICROALBUMIN / CREATININE URINE RATIO
Creatinine, Urine: 145.1 mg/dL
Microalb/Creat Ratio: 18 mg/g creat (ref 0–29)
Microalbumin, Urine: 25.5 ug/mL

## 2022-04-12 ENCOUNTER — Other Ambulatory Visit: Payer: Self-pay | Admitting: Internal Medicine

## 2022-04-12 LAB — CMP14+EGFR
ALT: 26 IU/L (ref 0–32)
AST: 23 IU/L (ref 0–40)
Albumin/Globulin Ratio: 1.5 (ref 1.2–2.2)
Albumin: 4.5 g/dL (ref 3.9–4.9)
Alkaline Phosphatase: 98 IU/L (ref 44–121)
BUN/Creatinine Ratio: 6 — ABNORMAL LOW (ref 9–23)
BUN: 7 mg/dL (ref 6–24)
Bilirubin Total: <0.2 mg/dL (ref 0.0–1.2)
CO2: 24 mmol/L (ref 20–29)
Calcium: 10.3 mg/dL — ABNORMAL HIGH (ref 8.7–10.2)
Chloride: 102 mmol/L (ref 96–106)
Creatinine, Ser: 1.16 mg/dL — ABNORMAL HIGH (ref 0.57–1.00)
Globulin, Total: 3.1 g/dL (ref 1.5–4.5)
Glucose: 91 mg/dL (ref 70–99)
Potassium: 4.1 mmol/L (ref 3.5–5.2)
Sodium: 141 mmol/L (ref 134–144)
Total Protein: 7.6 g/dL (ref 6.0–8.5)
eGFR: 57 mL/min/{1.73_m2} — ABNORMAL LOW (ref >59)

## 2022-04-12 LAB — CBC
Hematocrit: 43.1 % (ref 34.0–46.6)
Hemoglobin: 14.7 g/dL (ref 11.1–15.9)
MCH: 30.2 pg (ref 26.6–33.0)
MCHC: 34.1 g/dL (ref 31.5–35.7)
MCV: 89 fL (ref 79–97)
Platelets: 270 10*3/uL (ref 150–450)
RBC: 4.87 x10E6/uL (ref 3.77–5.28)
RDW: 13.8 % (ref 11.7–15.4)
WBC: 8.6 10*3/uL (ref 3.4–10.8)

## 2022-04-12 LAB — VITAMIN D 25 HYDROXY (VIT D DEFICIENCY, FRACTURES): Vit D, 25-Hydroxy: 19.9 ng/mL — ABNORMAL LOW (ref 30.0–100.0)

## 2022-04-12 LAB — LIPID PANEL
Chol/HDL Ratio: 4.8 ratio — ABNORMAL HIGH (ref 0.0–4.4)
Cholesterol, Total: 203 mg/dL — ABNORMAL HIGH (ref 100–199)
HDL: 42 mg/dL (ref >39)
LDL Chol Calc (NIH): 133 mg/dL — ABNORMAL HIGH (ref 0–99)
Triglycerides: 154 mg/dL — ABNORMAL HIGH (ref 0–149)
VLDL Cholesterol Cal: 28 mg/dL (ref 5–40)

## 2022-04-12 LAB — TSH: TSH: 2.08 u[IU]/mL (ref 0.450–4.500)

## 2022-04-12 LAB — HEMOGLOBIN A1C
Est. average glucose Bld gHb Est-mCnc: 157 mg/dL
Hgb A1c MFr Bld: 7.1 % — ABNORMAL HIGH (ref 4.8–5.6)

## 2022-04-12 MED ORDER — VITAMIN D (ERGOCALCIFEROL) 1.25 MG (50000 UNIT) PO CAPS: 50000.0000 [IU] | ORAL_CAPSULE | ORAL | 1 refills | Status: DC

## 2022-04-13 ENCOUNTER — Other Ambulatory Visit: Payer: Self-pay | Admitting: Nurse Practitioner

## 2022-04-13 DIAGNOSIS — R030 Elevated blood-pressure reading, without diagnosis of hypertension: Secondary | ICD-10-CM

## 2022-05-19 ENCOUNTER — Encounter: Payer: Self-pay | Admitting: Internal Medicine

## 2022-05-20 ENCOUNTER — Other Ambulatory Visit: Payer: Self-pay | Admitting: Internal Medicine

## 2022-05-25 ENCOUNTER — Other Ambulatory Visit: Payer: Self-pay | Admitting: Internal Medicine

## 2022-05-25 MED ORDER — SEMAGLUTIDE(0.25 OR 0.5MG/DOS) 2 MG/3ML ~~LOC~~ SOPN: 0.5000 mg | PEN_INJECTOR | SUBCUTANEOUS | 1 refills | Status: DC

## 2022-06-19 ENCOUNTER — Other Ambulatory Visit: Payer: Self-pay | Admitting: Internal Medicine

## 2022-06-19 MED ORDER — METFORMIN HCL ER 500 MG PO TB24: 500.0000 mg | ORAL_TABLET | Freq: Every day | ORAL | 0 refills | Status: DC

## 2022-09-20 NOTE — Progress Notes (Signed)
I,Victoria T Hamilton,acting as a scribe for Gwynneth Aliment, MD.,have documented all relevant documentation on the behalf of Gwynneth Aliment, MD,as directed by  Gwynneth Aliment, MD while in the presence of Gwynneth Aliment, MD.    Subjective:     Patient ID: Martha [name] , female    DOB: [birth year] , 52 y.o.   MRN: [id number]   Chief Complaint  Patient presents with   Hypertension    HPI  Pt presents today for BPC. She reports compliance with meds. She denies headaches, chest pain and shortness of breath. She reports no specific questions or concerns.   Hypertension This is a chronic problem. The current episode started more than 1 year ago. The problem has been gradually improving since onset. The problem is controlled. Pertinent negatives include no blurred vision or chest pain. Risk factors for coronary artery disease include diabetes mellitus. Past treatments include calcium channel blockers and diuretics. There is no history of CVA or retinopathy. There is no history of coarctation of the aorta, hyperparathyroidism or a thyroid problem.     Past Medical History:  Diagnosis Date   Infertility associated with anovulation      Family History  Problem Relation Age of Onset   Hypertension Father    Diabetes Father    Breast cancer Mother 55   Ovarian cancer Maternal Grandmother      Current Outpatient Medications:    amLODipine (NORVASC) 5 MG tablet, TAKE 1 TABLET (5 MG TOTAL) BY MOUTH DAILY., Disp: 90 tablet, Rfl: 3   Cholecalciferol (VITAMIN D) 50 MCG (2000 UT) tablet, Take 2,000 Units by mouth daily., Disp: , Rfl:    COLLAGEN PO, Take by mouth., Disp: , Rfl:    cyclobenzaprine (FLEXERIL) 10 MG tablet, Take 1 tablet by mouth every 8 (eight) hours as needed., Disp: , Rfl:    gabapentin (NEURONTIN) 300 MG capsule, TAKE 1 CAPSULE BY MOUTH THREE TIMES A DAY, Disp: , Rfl:    Glucosamine-Chondroitin-MSM 500-200-150 MG TABS, Take by mouth., Disp: , Rfl:    hydrochlorothiazide  (HYDRODIURIL) 12.5 MG tablet, TAKE 1 TABLET BY MOUTH EVERY DAY, Disp: 90 tablet, Rfl: 1   levonorgestrel (MIRENA) 20 MCG/24HR IUD, 1 each by Intrauterine route once., Disp: , Rfl:    MAGNESIUM OXIDE PO, Take 250 mg by mouth daily at 6 (six) AM., Disp: , Rfl:    POTASSIUM PO, Take by mouth daily. , Disp: , Rfl:    Vitamin D, Ergocalciferol, (DRISDOL) 1.25 MG (50000 UNIT) CAPS capsule, Take 1 capsule (50,000 Units total) by mouth every 7 (seven) days., Disp: 12 capsule, Rfl: 1   metFORMIN (GLUCOPHAGE-XR) 500 MG 24 hr tablet, Take 1 tablet (500 mg total) by mouth daily with breakfast., Disp: 90 tablet, Rfl: 1   Semaglutide,0.25 or 0.5MG /DOS, 2 MG/3ML SOPN, Inject 0.5 mg into the skin once a week., Disp: 3 mL, Rfl: 1   No Known Allergies   Review of Systems  Constitutional: Negative.   Eyes:  Negative for blurred vision.  Respiratory: Negative.    Cardiovascular: Negative.  Negative for chest pain.  Neurological: Negative.   Psychiatric/Behavioral: Negative.       Today's Vitals   09/21/22 1058  BP: 130/82  Pulse: 68  Temp: 98.1 F (36.7 C)  SpO2: 98%  Weight: 234 lb 3.2 oz (106.2 kg)   Body mass index is 36.46 kg/m.  Wt Readings from Last 3 Encounters:  09/21/22 234 lb 3.2 oz (106.2 kg)  04/05/22 234 lb  9.6 oz (106.4 kg)  08/10/21 226 lb 9.6 oz (102.8 kg)    Objective:  Physical Exam Vitals and nursing note reviewed.  Constitutional:      Appearance: Normal appearance.  HENT:     Head: Normocephalic and atraumatic.  Eyes:     Extraocular Movements: Extraocular movements intact.  Cardiovascular:     Rate and Rhythm: Normal rate and regular rhythm.     Heart sounds: Normal heart sounds.  Pulmonary:     Effort: Pulmonary effort is normal.     Breath sounds: Normal breath sounds.  Skin:    General: Skin is warm.  Neurological:     General: No focal deficit present.     Mental Status: She is alert.  Psychiatric:        Mood and Affect: Mood normal.        Behavior:  Behavior normal.     Assessment And Plan:     1. Obesity, diabetes, and hypertension syndrome (HCC) Comments: New dx: Her a1c>6.5 at last visit, started on metformin. Will add Ozempic, 0.25mg  weekly today. Possible side effects d/w pt, denies family h/o thyroid cancer. I plan to increase her to 0.5mg  if tolerated. She agrees to f/u in 3 months for re-evaluation. Her BP is fairly controlled, goal BP<120/80.  She will c/w amlodipine and hctz. I plan to add ARB at next visit.  - Hemoglobin A1c - BMP8+EGFR  2. Class 2 severe obesity due to excess calories with serious comorbidity and body mass index (BMI) of 36.0 to 36.9 in adult [facility]) Comments: She is encouraged to aim for at least 150 minutes  of exercise/week, while striving for BMI<30 ot decrease cardiac risk.  3. Need for shingles vaccine - Zoster Recombinant (Shingrix )  Patient was given opportunity to ask questions. Patient verbalized understanding of the plan and was able to repeat key elements of the plan. All questions were answered to their satisfaction.   I, Gwynneth Aliment, MD, have reviewed all documentation for this visit. The documentation on 09/21/22 for the exam, diagnosis, procedures, and orders are all accurate and complete.   IF YOU HAVE BEEN REFERRED TO A SPECIALIST, IT MAY TAKE 1-2 WEEKS TO SCHEDULE/PROCESS THE REFERRAL. IF YOU HAVE NOT HEARD FROM US/SPECIALIST IN TWO WEEKS, PLEASE GIVE [place] A CALL AT [phone].   THE PATIENT IS ENCOURAGED TO PRACTICE SOCIAL DISTANCING DUE TO THE COVID-19 PANDEMIC.

## 2022-09-20 NOTE — Patient Instructions (Signed)
Hypertension, Adult ?Hypertension is another name for high blood pressure. High blood pressure forces your heart to work harder to pump blood. This can cause problems over time. ?There are two numbers in a blood pressure reading. There is a top number (systolic) over a bottom number (diastolic). It is best to have a blood pressure that is below 120/80. ?What are the causes? ?The cause of this condition is not known. Some other conditions can lead to high blood pressure. ?What increases the risk? ?Some lifestyle factors can make you more likely to develop high blood pressure: ?Smoking. ?Not getting enough exercise or physical activity. ?Being overweight. ?Having too much fat, sugar, calories, or salt (sodium) in your diet. ?Drinking too much alcohol. ?Other risk factors include: ?Having any of these conditions: ?Heart disease. ?Diabetes. ?High cholesterol. ?Kidney disease. ?Obstructive sleep apnea. ?Having a family history of high blood pressure and high cholesterol. ?Age. The risk increases with age. ?Stress. ?What are the signs or symptoms? ?High blood pressure may not cause symptoms. Very high blood pressure (hypertensive crisis) may cause: ?Headache. ?Fast or uneven heartbeats (palpitations). ?Shortness of breath. ?Nosebleed. ?Vomiting or feeling like you may vomit (nauseous). ?Changes in how you see. ?Very bad chest pain. ?Feeling dizzy. ?Seizures. ?How is this treated? ?This condition is treated by making healthy lifestyle changes, such as: ?Eating healthy foods. ?Exercising more. ?Drinking less alcohol. ?Your doctor may prescribe medicine if lifestyle changes do not help enough and if: ?Your top number is above 130. ?Your bottom number is above 80. ?Your personal target blood pressure may vary. ?Follow these instructions at home: ?Eating and drinking ? ?If told, follow the DASH eating plan. To follow this plan: ?Fill one half of your plate at each meal with fruits and vegetables. ?Fill one fourth of your plate  at each meal with whole grains. Whole grains include whole-wheat pasta, brown rice, and whole-grain bread. ?Eat or drink low-fat dairy products, such as skim milk or low-fat yogurt. ?Fill one fourth of your plate at each meal with low-fat (lean) proteins. Low-fat proteins include fish, chicken without skin, eggs, beans, and tofu. ?Avoid fatty meat, cured and processed meat, or chicken with skin. ?Avoid pre-made or processed food. ?Limit the amount of salt in your diet to less than 1,500 mg each day. ?Do not drink alcohol if: ?Your doctor tells you not to drink. ?You are pregnant, may be pregnant, or are planning to become pregnant. ?If you drink alcohol: ?Limit how much you have to: ?0-1 drink a day for women. ?0-2 drinks a day for men. ?Know how much alcohol is in your drink. In the [place] one drink equals one 12 oz bottle of beer (355 mL), one 5 oz glass of wine (148 mL), or one 1? oz glass of hard liquor (44 mL). ?Lifestyle ? ?Work with your doctor to stay at a healthy weight or to lose weight. Ask your doctor what the best weight is for you. ?Get at least 30 minutes of exercise that causes your heart to beat faster (aerobic exercise) most days of the week. This may include walking, swimming, or biking. ?Get at least 30 minutes of exercise that strengthens your muscles (resistance exercise) at least 3 days a week. This may include lifting weights or doing Pilates. ?Do not smoke or use any products that contain nicotine or tobacco. If you need help quitting, ask your doctor. ?Check your blood pressure at home as told by your doctor. ?Keep all follow-up visits. ?Medicines ?Take over-the-counter and prescription medicines   only as told by your doctor. Follow directions carefully. ?Do not skip doses of blood pressure medicine. The medicine does not work as well if you skip doses. Skipping doses also puts you at risk for problems. ?Ask your doctor about side effects or reactions to medicines that you should watch  for. ?Contact a doctor if: ?You think you are having a reaction to the medicine you are taking. ?You have headaches that keep coming back. ?You feel dizzy. ?You have swelling in your ankles. ?You have trouble with your vision. ?Get help right away if: ?You get a very bad headache. ?You start to feel mixed up (confused). ?You feel weak or numb. ?You feel faint. ?You have very bad pain in your: ?Chest. ?Belly (abdomen). ?You vomit more than once. ?You have trouble breathing. ?These symptoms may be an emergency. Get help right away. Call 911. ?Do not wait to see if the symptoms will go away. ?Do not drive yourself to the hospital. ?Summary ?Hypertension is another name for high blood pressure. ?High blood pressure forces your heart to work harder to pump blood. ?For most people, a normal blood pressure is less than 120/80. ?Making healthy choices can help lower blood pressure. If your blood pressure does not get lower with healthy choices, you may need to take medicine. ?This information is not intended to replace advice given to you by your health care provider. Make sure you discuss any questions you have with your health care provider. ?Document Revised: 02/24/2021 Document Reviewed: 02/24/2021 ?Elsevier Patient Education ? 2023 Elsevier Inc. ? ?

## 2022-09-21 ENCOUNTER — Encounter: Payer: Self-pay | Admitting: Internal Medicine

## 2022-09-21 ENCOUNTER — Ambulatory Visit (INDEPENDENT_AMBULATORY_CARE_PROVIDER_SITE_OTHER): Payer: 59 | Admitting: Internal Medicine

## 2022-09-21 VITALS — BP 130/82 | HR 68 | Temp 98.1°F | Wt 234.2 lb

## 2022-09-21 DIAGNOSIS — I152 Hypertension secondary to endocrine disorders: Secondary | ICD-10-CM

## 2022-09-21 DIAGNOSIS — E1159 Type 2 diabetes mellitus with other circulatory complications: Secondary | ICD-10-CM

## 2022-09-21 DIAGNOSIS — F4321 Adjustment disorder with depressed mood: Secondary | ICD-10-CM

## 2022-09-21 DIAGNOSIS — Z23 Encounter for immunization: Secondary | ICD-10-CM | POA: Diagnosis not present

## 2022-09-21 DIAGNOSIS — E559 Vitamin D deficiency, unspecified: Secondary | ICD-10-CM

## 2022-09-21 DIAGNOSIS — Z6836 Body mass index (BMI) 36.0-36.9, adult: Secondary | ICD-10-CM

## 2022-09-21 DIAGNOSIS — E119 Type 2 diabetes mellitus without complications: Secondary | ICD-10-CM

## 2022-09-21 DIAGNOSIS — I10 Essential (primary) hypertension: Secondary | ICD-10-CM

## 2022-09-21 DIAGNOSIS — E1169 Type 2 diabetes mellitus with other specified complication: Secondary | ICD-10-CM

## 2022-09-22 ENCOUNTER — Other Ambulatory Visit: Payer: Self-pay | Admitting: Internal Medicine

## 2022-09-22 ENCOUNTER — Telehealth: Payer: Self-pay

## 2022-09-22 LAB — BMP8+EGFR
BUN/Creatinine Ratio: 8 — ABNORMAL LOW (ref 9–23)
BUN: 8 mg/dL (ref 6–24)
CO2: 22 mmol/L (ref 20–29)
Calcium: 10 mg/dL (ref 8.7–10.2)
Chloride: 104 mmol/L (ref 96–106)
Creatinine, Ser: 0.98 mg/dL (ref 0.57–1.00)
Glucose: 90 mg/dL (ref 70–99)
Potassium: 4.1 mmol/L (ref 3.5–5.2)
Sodium: 142 mmol/L (ref 134–144)
eGFR: 70 mL/min/{1.73_m2} (ref >59)

## 2022-09-22 LAB — HEMOGLOBIN A1C
Est. average glucose Bld gHb Est-mCnc: 151 mg/dL
Hgb A1c MFr Bld: 6.9 % — ABNORMAL HIGH (ref 4.8–5.6)

## 2022-09-22 MED ORDER — METFORMIN HCL ER 500 MG PO TB24: 500.0000 mg | ORAL_TABLET | Freq: Every day | ORAL | 1 refills | Status: DC

## 2022-09-22 MED ORDER — SEMAGLUTIDE(0.25 OR 0.5MG/DOS) 2 MG/3ML ~~LOC~~ SOPN: 0.5000 mg | PEN_INJECTOR | SUBCUTANEOUS | 1 refills | Status: DC

## 2022-09-22 NOTE — Telephone Encounter (Signed)
PA for ozempic has been submitted through cover my meds and we are waiting on the determination. YL,RMA

## 2022-09-22 NOTE — Progress Notes (Signed)
Your hba1c has improved slightly. I do want you to continue with metformin once daily for now. I will send in a refill.   Touch base with me in a week or two to let me know how you feel on Ozempic. Your kidney function is stable. See you in 3 months!  RS

## 2022-09-25 ENCOUNTER — Encounter: Payer: Self-pay | Admitting: Internal Medicine

## 2022-09-25 DIAGNOSIS — I152 Hypertension secondary to endocrine disorders: Secondary | ICD-10-CM | POA: Insufficient documentation

## 2022-09-27 NOTE — Telephone Encounter (Signed)
PA for ozempic is not required at this time per [facility]. YL,RMA

## 2022-10-04 ENCOUNTER — Ambulatory Visit: Payer: 59 | Admitting: Internal Medicine

## 2022-10-09 ENCOUNTER — Other Ambulatory Visit: Payer: Self-pay | Admitting: Internal Medicine

## 2022-10-09 DIAGNOSIS — R030 Elevated blood-pressure reading, without diagnosis of hypertension: Secondary | ICD-10-CM

## 2022-10-17 ENCOUNTER — Encounter: Payer: Self-pay | Admitting: Internal Medicine

## 2022-11-06 ENCOUNTER — Other Ambulatory Visit: Payer: Self-pay | Admitting: Internal Medicine

## 2023-01-08 ENCOUNTER — Other Ambulatory Visit: Payer: Self-pay | Admitting: Internal Medicine

## 2023-03-06 ENCOUNTER — Other Ambulatory Visit: Payer: Self-pay | Admitting: Internal Medicine

## 2023-04-21 ENCOUNTER — Other Ambulatory Visit: Payer: Self-pay | Admitting: Internal Medicine

## 2023-04-21 DIAGNOSIS — R030 Elevated blood-pressure reading, without diagnosis of hypertension: Secondary | ICD-10-CM

## 2023-04-24 ENCOUNTER — Encounter: Payer: 59 | Admitting: Internal Medicine

## 2023-04-28 ENCOUNTER — Other Ambulatory Visit: Payer: Self-pay | Admitting: Internal Medicine

## 2023-04-28 LAB — HEMOGLOBIN A1C: Hemoglobin A1C: 5

## 2023-04-28 LAB — COMPREHENSIVE METABOLIC PANEL
Albumin: 4.6 (ref 3.5–5.0)
Calcium: 9.8 (ref 8.7–10.7)
Globulin: 0.4
eGFR: 56

## 2023-04-28 LAB — CBC AND DIFFERENTIAL
HCT: 47 — AB (ref 36–46)
Hemoglobin: 14.9 (ref 12.0–16.0)
Neutrophils Absolute: 4.6
Platelets: 265 10*3/uL (ref 150–400)
WBC: 8.1

## 2023-04-28 LAB — HEPATIC FUNCTION PANEL
ALT: 19 U/L (ref 7–35)
AST: 21 (ref 13–35)
Alkaline Phosphatase: 108 (ref 25–125)

## 2023-04-28 LAB — BASIC METABOLIC PANEL
BUN: 8 (ref 4–21)
CO2: 23 — AB (ref 13–22)
Chloride: 103 (ref 99–108)
Creatinine: 1.2 — AB (ref 0.5–1.1)
Glucose: 81
Potassium: 3.9 meq/L (ref 3.5–5.1)
Sodium: 139 (ref 137–147)

## 2023-04-28 LAB — LIPID PANEL
Cholesterol: 189 (ref 0–200)
HDL: 43 (ref 35–70)
LDL Cholesterol: 132
Triglycerides: 74 (ref 40–160)

## 2023-04-28 LAB — CBC: RBC: 5.07 (ref 3.87–5.11)

## 2023-04-28 LAB — LAB REPORT - SCANNED
A1c: 5
EGFR: 56

## 2023-05-01 ENCOUNTER — Ambulatory Visit: Payer: 59 | Admitting: Internal Medicine

## 2023-05-01 ENCOUNTER — Encounter: Payer: Self-pay | Admitting: Internal Medicine

## 2023-05-01 VITALS — BP 122/74 | HR 80 | Temp 98.4°F | Ht 67.0 in | Wt 212.6 lb

## 2023-05-01 DIAGNOSIS — E1169 Type 2 diabetes mellitus with other specified complication: Secondary | ICD-10-CM

## 2023-05-01 DIAGNOSIS — Z6833 Body mass index (BMI) 33.0-33.9, adult: Secondary | ICD-10-CM

## 2023-05-01 DIAGNOSIS — E66811 Obesity, class 1: Secondary | ICD-10-CM

## 2023-05-01 DIAGNOSIS — R202 Paresthesia of skin: Secondary | ICD-10-CM | POA: Diagnosis not present

## 2023-05-01 DIAGNOSIS — E785 Hyperlipidemia, unspecified: Secondary | ICD-10-CM

## 2023-05-01 DIAGNOSIS — Z0000 Encounter for general adult medical examination without abnormal findings: Secondary | ICD-10-CM

## 2023-05-01 DIAGNOSIS — E559 Vitamin D deficiency, unspecified: Secondary | ICD-10-CM

## 2023-05-01 DIAGNOSIS — E6609 Other obesity due to excess calories: Secondary | ICD-10-CM

## 2023-05-01 LAB — POCT URINALYSIS DIPSTICK
Bilirubin, UA: NEGATIVE
Glucose, UA: NEGATIVE
Ketones, UA: NEGATIVE
Leukocytes, UA: NEGATIVE
Nitrite, UA: NEGATIVE
Protein, UA: NEGATIVE
Spec Grav, UA: >=1.03 — AB (ref 1.010–1.025)
Urobilinogen, UA: 0.2 U/dL
pH, UA: 5.5 (ref 5.0–8.0)

## 2023-05-01 MED ORDER — OZEMPIC (0.25 OR 0.5 MG/DOSE) 2 MG/3ML ~~LOC~~ SOPN: PEN_INJECTOR | SUBCUTANEOUS | 1 refills | Status: DC

## 2023-05-01 NOTE — Assessment & Plan Note (Addendum)
She was congratulated on her 22lb weight loss since May 2024. She is encouraged to aim for at least 150 minutes of exercise per week.

## 2023-05-01 NOTE — Assessment & Plan Note (Signed)

## 2023-05-01 NOTE — Patient Instructions (Signed)

## 2023-05-01 NOTE — Progress Notes (Signed)
I,Victoria T Deloria Lair, CMA,acting as a Neurosurgeon for Gwynneth Aliment, MD.,have documented all relevant documentation on the behalf of Gwynneth Aliment, MD,as directed by  Gwynneth Aliment, MD while in the presence of Gwynneth Aliment, MD.  Subjective:    Patient ID: Martha [name] , female    DOB: May 30, 1970 , 52 y.o.   MRN: [id number]  Chief Complaint  Patient presents with   Annual Exam   Diabetes   Hypertension    HPI  Patient presents today for annual exam, Patient reports compliance with medications. She is followed by Gyn, Dr. Su Hilt for her pelvic exams. She denies headache, chest pain & sob.  She had labs drawn at work last week, agrees to send over the results.    Letter sent to Dr. Osborn Coho for pap.   Hypertension This is a chronic problem. The current episode started more than 1 year ago. The problem has been gradually improving since onset. The problem is controlled. Pertinent negatives include no blurred vision or chest pain. Past treatments include calcium channel blockers. The current treatment provides moderate improvement. There are no compliance problems.   Diabetes She presents for her follow-up diabetic visit. She has type 2 diabetes mellitus. Her disease course has been stable. There are no hypoglycemic associated symptoms. Pertinent negatives for diabetes include no blurred vision and no chest pain. There are no hypoglycemic complications. Symptoms are stable. Risk factors for coronary artery disease include diabetes mellitus and hypertension.  c   Past Medical History:  Diagnosis Date   Infertility associated with anovulation      Family History  Problem Relation Age of Onset   Hypertension Father    Diabetes Father    Breast cancer Mother 30   Ovarian cancer Maternal Grandmother      Current Outpatient Medications:    amLODipine (NORVASC) 5 MG tablet, TAKE 1 TABLET (5 MG TOTAL) BY MOUTH DAILY., Disp: 90 tablet, Rfl: 3   Cholecalciferol (VITAMIN D) 50  MCG (2000 UT) tablet, Take 2,000 Units by mouth daily., Disp: , Rfl:    cyclobenzaprine (FLEXERIL) 10 MG tablet, Take 1 tablet by mouth every 8 (eight) hours as needed., Disp: , Rfl:    gabapentin (NEURONTIN) 300 MG capsule, TAKE 1 CAPSULE BY MOUTH THREE TIMES A DAY, Disp: , Rfl:    Glucosamine-Chondroitin-MSM 500-200-150 MG TABS, Take by mouth., Disp: , Rfl:    hydrochlorothiazide (HYDRODIURIL) 12.5 MG tablet, TAKE 1 TABLET BY MOUTH EVERY DAY, Disp: 90 tablet, Rfl: 1   levonorgestrel (MIRENA) 20 MCG/24HR IUD, 1 each by Intrauterine route once., Disp: , Rfl:    MAGNESIUM OXIDE PO, Take 250 mg by mouth daily at 6 (six) AM., Disp: , Rfl:    POTASSIUM PO, Take by mouth daily. , Disp: , Rfl:    Vitamin D, Ergocalciferol, (DRISDOL) 1.25 MG (50000 UNIT) CAPS capsule, TAKE 1 CAPSULE (50,000 UNITS TOTAL) BY MOUTH EVERY 7 (SEVEN) DAYS, Disp: 12 capsule, Rfl: 1   metFORMIN (GLUCOPHAGE-XR) 500 MG 24 hr tablet, Take 1 tablet (500 mg total) by mouth daily with breakfast. (Patient not taking: Reported on 05/01/2023), Disp: 90 tablet, Rfl: 1   Semaglutide,0.25 or 0.5MG /DOS, (OZEMPIC, 0.25 OR 0.5 MG/DOSE,) 2 MG/3ML SOPN, INJECT 0.5 MG INTO THE SKIN ONE TIME PER WEEK, Disp: 9 mL, Rfl: 1   No Known Allergies    The patient states she uses IUD for birth control. No LMP recorded. (Menstrual status: IUD).. Negative for Dysmenorrhea. Negative for: breast discharge, breast lump(s),  breast pain and breast self exam. Associated symptoms include abnormal vaginal bleeding. Pertinent negatives include abnormal bleeding (hematology), anxiety, decreased libido, depression, difficulty falling sleep, dyspareunia, history of infertility, nocturia, sexual dysfunction, sleep disturbances, urinary incontinence, urinary urgency, vaginal discharge and vaginal itching. Diet regular.The patient states her exercise level is  moderate.  . The patient's tobacco use is:  Social History   Tobacco Use  Smoking Status Never  Smokeless  Tobacco Never  . She has been exposed to passive smoke. The patient's alcohol use is:  Social History   Substance and Sexual Activity  Alcohol Use Yes   Alcohol/week: 2.0 standard drinks of alcohol   Types: 1 Glasses of wine, 1 Shots of liquor per week   Comment: pt drinks occasionally    Review of Systems  Constitutional: Negative.   HENT: Negative.    Eyes: Negative.  Negative for blurred vision.  Respiratory: Negative.    Cardiovascular: Negative.  Negative for chest pain.  Gastrointestinal: Negative.   Endocrine: Negative.   Genitourinary: Negative.   Musculoskeletal: Negative.   Skin: Negative.   Allergic/Immunologic: Negative.   Neurological:  Positive for numbness.       She c/o numbness in fingertips. She has cervical spine disease. She has been evaluated by Neurosurgery. She has had cervical spine injection that has provided her some relief.   Hematological: Negative.   Psychiatric/Behavioral: Negative.       Today's Vitals   05/01/23 1035  BP: 122/74  Pulse: 80  Temp: 98.4 F (36.9 C)  SpO2: 98%  Weight: 212 lb 9.6 oz (96.4 kg)  Height: 5\' 7"  (1.702 m)   Body mass index is 33.3 kg/m.  Wt Readings from Last 3 Encounters:  05/01/23 212 lb 9.6 oz (96.4 kg)  09/21/22 234 lb 3.2 oz (106.2 kg)  04/05/22 234 lb 9.6 oz (106.4 kg)     Objective:  Physical Exam Vitals and nursing note reviewed.  Constitutional:      Appearance: Normal appearance.  HENT:     Head: Normocephalic and atraumatic.     Right Ear: Tympanic membrane, ear canal and external ear normal.     Left Ear: Tympanic membrane, ear canal and external ear normal.     Nose: Nose normal.     Mouth/Throat:     Mouth: Mucous membranes are moist.     Pharynx: Oropharynx is clear.  Eyes:     Extraocular Movements: Extraocular movements intact.     Conjunctiva/sclera: Conjunctivae normal.     Pupils: Pupils are equal, round, and reactive to light.  Cardiovascular:     Rate and Rhythm: Normal  rate and regular rhythm.     Pulses: Normal pulses.          Dorsalis pedis pulses are 2+ on the right side and 2+ on the left side.     Heart sounds: Normal heart sounds.  Pulmonary:     Effort: Pulmonary effort is normal.     Breath sounds: Normal breath sounds.  Chest:  Breasts:    Tanner Score is 5.     Right: Normal.     Left: Normal.  Abdominal:     General: Abdomen is flat. Bowel sounds are normal.     Palpations: Abdomen is soft.  Genitourinary:    Comments: deferred Musculoskeletal:        General: Normal range of motion.     Cervical back: Normal range of motion and neck supple.  Feet:     Right foot:  Protective Sensation: 5 sites tested.  5 sites sensed.     Left foot:     Protective Sensation: 5 sites tested.  5 sites sensed.     Comments: Ecchymoses bottom right foot Skin:    General: Skin is warm and dry.  Neurological:     General: No focal deficit present.     Mental Status: She is alert and oriented to person, place, and time.  Psychiatric:        Mood and Affect: Mood normal.        Behavior: Behavior normal.         Assessment And Plan:     Annual physical exam Assessment & Plan: A full exam was performed.  Importance of monthly self breast exams was discussed with the patient.  She is advised to get 30-45 minutes of regular exercise, no less than four to five days per week. Both weight-bearing and aerobic exercises are recommended.  She is advised to follow a healthy diet with at least six fruits/veggies per day, decrease intake of red meat and other saturated fats and to increase fish intake to twice weekly.  Meats/fish should not be fried -- baked, boiled or broiled is preferable. It is also important to cut back on your sugar intake.  Be sure to read labels - try to avoid anything with added sugar, high fructose corn syrup or other sweeteners.  If you must use a sweetener, you can try stevia or monkfruit.  It is also important to avoid artificially  sweetened foods/beverages and diet drinks. Lastly, wear SPF 50 sunscreen on exposed skin and when in direct sunlight for an extended period of time.  Be sure to avoid fast food restaurants and aim for at least 60 ounces of water daily.       Dyslipidemia due to type 2 diabetes mellitus (HCC) Assessment & Plan: Chronic, currently on Ozempic 0.5mg  weekly.  EKG performed, NSR w/o acute changes.  Diabetic foot exam was performed.  She will f/u in four months for re-evaluation. I DISCUSSED WITH THE PATIENT AT LENGTH REGARDING THE GOALS OF GLYCEMIC CONTROL AND POSSIBLE LONG-TERM COMPLICATIONS.  I  ALSO STRESSED THE IMPORTANCE OF COMPLIANCE WITH HOME GLUCOSE MONITORING, DIETARY RESTRICTIONS INCLUDING AVOIDANCE OF SUGARY DRINKS/PROCESSED FOODS,  ALONG WITH REGULAR EXERCISE.  I  ALSO STRESSED THE IMPORTANCE OF ANNUAL EYE EXAMS, SELF FOOT CARE AND COMPLIANCE WITH OFFICE VISITS.   Orders: -     POCT urinalysis dipstick -     Microalbumin / creatinine urine ratio -     EKG 12-Lead  Paresthesia of both hands Assessment & Plan: Sx are due to cervical spine disease. She is followed by Neurosurgery.    Class 1 obesity due to excess calories with serious comorbidity and body mass index (BMI) of 33.0 to 33.9 in adult Assessment & Plan: She was congratulated on her 22lb weight loss since May 2024. She is encouraged to aim for at least 150 minutes of exercise per week.     Other orders -     Ozempic (0.25 or 0.5 MG/DOSE); INJECT 0.5 MG INTO THE SKIN ONE TIME PER WEEK  Dispense: 9 mL; Refill: 1  She is encouraged to strive for BMI less than 30 to decrease cardiac risk. Advised to aim for at least 150 minutes of exercise per week.    Return for 1 year HM, 4 month dm. Patient was given opportunity to ask questions. Patient verbalized understanding of the plan and was able to repeat  key elements of the plan. All questions were answered to their satisfaction.   I, Gwynneth Aliment, MD, have reviewed all  documentation for this visit. The documentation on 05/01/23 for the exam, diagnosis, procedures, and orders are all accurate and complete.

## 2023-05-01 NOTE — Assessment & Plan Note (Addendum)
Chronic, currently on Ozempic 0.5mg  weekly.  EKG performed, NSR w/o acute changes.  Diabetic foot exam was performed.  She will f/u in four months for re-evaluation. I DISCUSSED WITH THE PATIENT AT LENGTH REGARDING THE GOALS OF GLYCEMIC CONTROL AND POSSIBLE LONG-TERM COMPLICATIONS.  I  ALSO STRESSED THE IMPORTANCE OF COMPLIANCE WITH HOME GLUCOSE MONITORING, DIETARY RESTRICTIONS INCLUDING AVOIDANCE OF SUGARY DRINKS/PROCESSED FOODS,  ALONG WITH REGULAR EXERCISE.  I  ALSO STRESSED THE IMPORTANCE OF ANNUAL EYE EXAMS, SELF FOOT CARE AND COMPLIANCE WITH OFFICE VISITS.

## 2023-05-03 LAB — MICROALBUMIN / CREATININE URINE RATIO
Creatinine, Urine: 238.7 mg/dL
Microalb/Creat Ratio: 17 mg/g{creat} (ref 0–29)
Microalbumin, Urine: 40.5 ug/mL

## 2023-05-05 DIAGNOSIS — R202 Paresthesia of skin: Secondary | ICD-10-CM | POA: Insufficient documentation

## 2023-05-05 NOTE — Assessment & Plan Note (Signed)
Sx are due to cervical spine disease. She is followed by Neurosurgery.

## 2023-06-03 ENCOUNTER — Other Ambulatory Visit: Payer: Self-pay | Admitting: Internal Medicine

## 2023-08-30 ENCOUNTER — Ambulatory Visit (INDEPENDENT_AMBULATORY_CARE_PROVIDER_SITE_OTHER): Payer: 59 | Admitting: Internal Medicine

## 2023-08-30 ENCOUNTER — Encounter: Payer: Self-pay | Admitting: Internal Medicine

## 2023-08-30 VITALS — BP 132/90 | HR 80 | Temp 98.3°F | Ht 67.0 in | Wt 216.6 lb

## 2023-08-30 DIAGNOSIS — I152 Hypertension secondary to endocrine disorders: Secondary | ICD-10-CM | POA: Diagnosis not present

## 2023-08-30 DIAGNOSIS — E1159 Type 2 diabetes mellitus with other circulatory complications: Secondary | ICD-10-CM | POA: Diagnosis not present

## 2023-08-30 DIAGNOSIS — E1169 Type 2 diabetes mellitus with other specified complication: Secondary | ICD-10-CM

## 2023-08-30 DIAGNOSIS — E6609 Other obesity due to excess calories: Secondary | ICD-10-CM

## 2023-08-30 DIAGNOSIS — E66811 Obesity, class 1: Secondary | ICD-10-CM

## 2023-08-30 DIAGNOSIS — Z6833 Body mass index (BMI) 33.0-33.9, adult: Secondary | ICD-10-CM

## 2023-08-30 NOTE — Progress Notes (Signed)
 I,Victoria T Basil Lim, CMA,acting as a Neurosurgeon for Smiley Dung, MD.,have documented all relevant documentation on the behalf of Smiley Dung, MD,as directed by  Smiley Dung, MD while in the presence of Smiley Dung, MD.  Subjective:  Patient ID: Martha [name] , female    DOB: 1970/10/13 , 53 y.o.   MRN: [id number]  Chief Complaint  Patient presents with   Diabetes    Patient presents today for diabetes & bpc. She reports compliance with medications. Denies headache, chest pain & sob. She will call to schedule pap, mammogram & dm eye exam.     Hypertension     Hypertension This is a chronic problem. The current episode started more than 1 year ago. The problem has been gradually improving since onset. The problem is controlled. Pertinent negatives include no blurred vision or chest pain. Past treatments include calcium channel blockers. The current treatment provides moderate improvement. There are no compliance problems.   Diabetes She presents for her follow-up diabetic visit. She has type 2 diabetes mellitus. Her disease course has been stable. There are no hypoglycemic associated symptoms. Pertinent negatives for diabetes include no blurred vision and no chest pain. There are no hypoglycemic complications. Symptoms are stable. Risk factors for coronary artery disease include diabetes mellitus and hypertension.     Past Medical History:  Diagnosis Date   Infertility associated with anovulation      Family History  Problem Relation Age of Onset   Hypertension Father    Diabetes Father    Breast cancer Mother 92   Ovarian cancer Maternal Grandmother      Current Outpatient Medications:    amLODipine  (NORVASC ) 5 MG tablet, TAKE 1 TABLET (5 MG TOTAL) BY MOUTH DAILY., Disp: 90 tablet, Rfl: 3   Cholecalciferol (VITAMIN D ) 50 MCG (2000 UT) tablet, Take 2,000 Units by mouth daily., Disp: , Rfl:    cyclobenzaprine (FLEXERIL) 10 MG tablet, Take 1 tablet by mouth every 8  (eight) hours as needed., Disp: , Rfl:    gabapentin  (NEURONTIN ) 300 MG capsule, TAKE 1 CAPSULE BY MOUTH THREE TIMES A DAY, Disp: , Rfl:    Glucosamine-Chondroitin-MSM 500-200-150 MG TABS, Take by mouth., Disp: , Rfl:    levonorgestrel  (MIRENA ) 20 MCG/24HR IUD, 1 each by Intrauterine route once., Disp: , Rfl:    MAGNESIUM OXIDE PO, Take 250 mg by mouth daily at 6 (six) AM., Disp: , Rfl:    POTASSIUM PO, Take by mouth daily. , Disp: , Rfl:    Semaglutide ,0.25 or 0.5MG /DOS, (OZEMPIC , 0.25 OR 0.5 MG/DOSE,) 2 MG/3ML SOPN, INJECT 0.5 MG INTO THE SKIN ONE TIME PER WEEK, Disp: 9 mL, Rfl: 1   Vitamin D , Ergocalciferol , (DRISDOL ) 1.25 MG (50000 UNIT) CAPS capsule, TAKE 1 CAPSULE (50,000 UNITS TOTAL) BY MOUTH EVERY 7 (SEVEN) DAYS, Disp: 12 capsule, Rfl: 1   hydrochlorothiazide  (HYDRODIURIL ) 12.5 MG tablet, TAKE 1 TABLET BY MOUTH EVERY DAY, Disp: 90 tablet, Rfl: 1   No Known Allergies   Review of Systems  Constitutional: Negative.   Eyes:  Negative for blurred vision.  Respiratory: Negative.    Cardiovascular: Negative.  Negative for chest pain.  Gastrointestinal: Negative.   Neurological: Negative.   Psychiatric/Behavioral: Negative.       Today's Vitals   08/30/23 1159 08/30/23 1216  BP: (!) 130/90 (!) 132/90  Pulse: 80   Temp: 98.3 F (36.8 C)   SpO2: 98%   Weight: 216 lb 9.6 oz (98.2 kg)   Height: 5\' 7"  (  1.702 m)    Body mass index is 33.92 kg/m.  Wt Readings from Last 3 Encounters:  08/30/23 216 lb 9.6 oz (98.2 kg)  05/01/23 212 lb 9.6 oz (96.4 kg)  09/21/22 234 lb 3.2 oz (106.2 kg)    BP Readings from Last 3 Encounters:  08/30/23 (!) 132/90  05/01/23 122/74  09/21/22 130/82     Objective:  Physical Exam Vitals and nursing note reviewed.  Constitutional:      Appearance: Normal appearance. She is obese.  HENT:     Head: Normocephalic and atraumatic.  Eyes:     Extraocular Movements: Extraocular movements intact.  Cardiovascular:     Rate and Rhythm: Normal rate and  regular rhythm.     Heart sounds: Normal heart sounds.  Pulmonary:     Effort: Pulmonary effort is normal.     Breath sounds: Normal breath sounds.  Musculoskeletal:     Cervical back: Normal range of motion.  Skin:    General: Skin is warm.  Neurological:     General: No focal deficit present.     Mental Status: She is alert.  Psychiatric:        Mood and Affect: Mood normal.        Behavior: Behavior normal.         Assessment And Plan:  Obesity, diabetes, and hypertension syndrome (HCC) Assessment & Plan: Chronic, currently on Ozempic  0.5mg  weekly. She will f/u in four months for re-evaluation. BP is elevated today.  Currently taking amlodipine  5mg  daily.  We discussed use of ARB and renal protection in DM. Will check renal function today. I plan to start low dose valsartan. If started, she will need NV in two weeks w/ recheck of BMP. Will make further recommendations after I am able to review her labs.   Orders: -     CMP14+EGFR -     Hemoglobin A1c  Class 1 obesity due to excess calories with serious comorbidity and body mass index (BMI) of 33.0 to 33.9 in adult Assessment & Plan: She has lost 18lbs since May 2024.  She is encouraged to aim for at least 150 minutes of exercise per week, including  strength training to decrease risk of muscle atrophy while on Glp-1 therapy. Also encouraged to optimize her protein intake.       Return in 4 months (on 12/30/2023), or dm check.  Patient was given opportunity to ask questions. Patient verbalized understanding of the plan and was able to repeat key elements of the plan. All questions were answered to their satisfaction.    I, Smiley Dung, MD, have reviewed all documentation for this visit. The documentation on 08/30/23 for the exam, diagnosis, procedures, and orders are all accurate and complete.   IF YOU HAVE BEEN REFERRED TO A SPECIALIST, IT MAY TAKE 1-2 WEEKS TO SCHEDULE/PROCESS THE REFERRAL. IF YOU HAVE NOT HEARD FROM  US /SPECIALIST IN TWO WEEKS, PLEASE GIVE US  A CALL AT [phone].   THE PATIENT IS ENCOURAGED TO PRACTICE SOCIAL DISTANCING DUE TO THE COVID-19 PANDEMIC.

## 2023-08-30 NOTE — Patient Instructions (Addendum)
 Increase Ozempic 0.5mg  PLUS 0.25mg   Diabetes Action Plan A diabetes action plan is a way for you to manage your symptoms of diabetes, also called diabetes mellitus. The plan is color-coded to guide you on what actions to take based on any symptoms you're having. If you have symptoms in the red zone, you need medical care right away. If you have symptoms in the yellow zone, your diabetes isn't under control, and you may need to make some changes. If you have symptoms in the green zone, you're doing well. Understanding diabetes can take time. Follow the treatment plan that you created with your health care provider. Know the target range for your blood sugar, also called glucose. Review your plan each time you visit your provider. The target range for my blood sugar level is __________________________ mg/dL. Red zone Get medical help right away if you have any of the following symptoms: A blood sugar test result that's below 54 mg/dL (3 mmol/L). A blood sugar test result that's at or above 240 mg/dL (16.1 mmol/L) for 2 days in a row along with: Extreme thirst and frequent peeing. Confusion or trouble thinking clearly. Moderate or large ketone levels in your pee (urine). Feeling tired or having no energy. Trouble breathing. Sickness or a fever for 2 or more days that's not getting better. These symptoms may be an emergency. Call 911 right away. Do not wait to see if the symptoms will go away. Do not drive yourself to the hospital. If you have very low blood sugar, also called severe hypoglycemia, and you can't eat or drink, you may need glucagon. Make sure a family member or close friend knows how to check your blood sugar and how to give you glucagon. You may need to be treated in a hospital for this condition. Yellow zone If you have any of the following symptoms, your diabetes isn't under control, and you may need to make some changes: A blood sugar test result that's at or above 240 mg/dL  (09.6 mmol/L) for 2 days in a row. Blood sugar test results that are below 70 mg/dL (3.9 mmol/L). Other symptoms of hypoglycemia, such as: Shaking or feeling light-headed. Confusion or irritability. Feeling hungry. Having a fast heartbeat. If you have any yellow zone symptoms: Treat your hypoglycemia by eating or drinking 15 grams of a rapid-acting carbohydrate. Follow the 15:15 rule: Take 15 grams of a rapid-acting carbohydrate, such as: 1 tube of glucose gel. 4 glucose pills. 4 oz (120 mL) of fruit juice. 4 oz (120 mL) of regular (not diet) soda. Check your blood sugar again 15 minutes after you take the carbohydrate. If the second blood sugar test is still at or below 70 mg/dL (3.9 mmol/L), take 15 grams of a carbohydrate again. If your blood sugar doesn't increase above 70 mg/dL (3.9 mmol/L) after 3 tries, get medical help right away. After your blood sugar returns to normal, eat a meal or a snack within 1 hour. Keep taking your daily medicines as told by your provider. Check your blood sugar more often than you normally would. Write down your results. Call your provider if you have trouble keeping your blood sugar in your target range. Green zone These signs mean you're doing well and can continue what you're doing to manage your diabetes: Your blood sugar is within your personal target range. For most people, a blood sugar level before a meal should be 80-130 mg/dL (0.4-5.4 mmol/L). You feel well, and you're able to do daily activities.  If you're in the green zone, continue to manage your diabetes as told by your provider. To do this: Eat a healthy diet. Exercise regularly. Check your blood sugar as told. Take your medicines only as told. Where to find more information American Diabetes Association (ADA): diabetes.org Association of Diabetes Care & Education Specialists (ADCES): adces.org/diabetes-education-dsmes This information is not intended to replace advice given to you  by your health care provider. Make sure you discuss any questions you have with your health care provider. Document Revised: 12/27/2022 Document Reviewed: 12/27/2022 Elsevier Patient Education  2024 [employer].

## 2023-08-31 ENCOUNTER — Encounter: Payer: Self-pay | Admitting: Internal Medicine

## 2023-08-31 ENCOUNTER — Other Ambulatory Visit: Payer: Self-pay | Admitting: Internal Medicine

## 2023-08-31 DIAGNOSIS — R030 Elevated blood-pressure reading, without diagnosis of hypertension: Secondary | ICD-10-CM

## 2023-08-31 LAB — CMP14+EGFR
ALT: 18 IU/L (ref 0–32)
AST: 16 IU/L (ref 0–40)
Albumin: 4.5 g/dL (ref 3.8–4.9)
Alkaline Phosphatase: 109 IU/L (ref 44–121)
BUN/Creatinine Ratio: 10 (ref 9–23)
BUN: 11 mg/dL (ref 6–24)
Bilirubin Total: 0.3 mg/dL (ref 0.0–1.2)
CO2: 21 mmol/L (ref 20–29)
Calcium: 9.8 mg/dL (ref 8.7–10.2)
Chloride: 106 mmol/L (ref 96–106)
Creatinine, Ser: 1.06 mg/dL — ABNORMAL HIGH (ref 0.57–1.00)
Globulin, Total: 3.3 g/dL (ref 1.5–4.5)
Glucose: 79 mg/dL (ref 70–99)
Potassium: 4.1 mmol/L (ref 3.5–5.2)
Sodium: 142 mmol/L (ref 134–144)
Total Protein: 7.8 g/dL (ref 6.0–8.5)
eGFR: 64 mL/min/{1.73_m2} (ref >59)

## 2023-08-31 LAB — HEMOGLOBIN A1C
Est. average glucose Bld gHb Est-mCnc: 123 mg/dL
Hgb A1c MFr Bld: 5.9 % — ABNORMAL HIGH (ref 4.8–5.6)

## 2023-09-09 NOTE — Assessment & Plan Note (Signed)
 She has lost 18lbs since May 2024.  She is encouraged to aim for at least 150 minutes of exercise per week, including  strength training to decrease risk of muscle atrophy while on Glp-1 therapy. Also encouraged to optimize her protein intake.

## 2023-09-09 NOTE — Assessment & Plan Note (Signed)
 Chronic, currently on Ozempic  0.5mg  weekly. She will f/u in four months for re-evaluation. BP is elevated today.  Currently taking amlodipine  5mg  daily.  We discussed use of ARB and renal protection in DM. Will check renal function today. I plan to start low dose valsartan. If started, she will need NV in two weeks w/ recheck of BMP. Will make further recommendations after I am able to review her labs.

## 2023-10-18 ENCOUNTER — Other Ambulatory Visit: Payer: Self-pay | Admitting: Internal Medicine

## 2023-10-22 ENCOUNTER — Other Ambulatory Visit: Payer: Self-pay

## 2023-10-22 MED ORDER — OZEMPIC (0.25 OR 0.5 MG/DOSE) 2 MG/3ML ~~LOC~~ SOPN: PEN_INJECTOR | SUBCUTANEOUS | 1 refills | Status: DC

## 2024-03-02 ENCOUNTER — Other Ambulatory Visit: Payer: Self-pay | Admitting: Internal Medicine

## 2024-03-02 DIAGNOSIS — R030 Elevated blood-pressure reading, without diagnosis of hypertension: Secondary | ICD-10-CM

## 2024-03-06 ENCOUNTER — Other Ambulatory Visit: Payer: Self-pay | Admitting: Internal Medicine

## 2024-03-06 MED ORDER — OZEMPIC (0.25 OR 0.5 MG/DOSE) 2 MG/3ML ~~LOC~~ SOPN: PEN_INJECTOR | SUBCUTANEOUS | 0 refills | Status: DC

## 2024-03-06 MED ORDER — SEMAGLUTIDE (1 MG/DOSE) 4 MG/3ML ~~LOC~~ SOPN: 1.0000 mg | PEN_INJECTOR | SUBCUTANEOUS | 1 refills | Status: AC

## 2024-04-30 ENCOUNTER — Ambulatory Visit: Payer: Self-pay | Admitting: Internal Medicine

## 2024-05-01 ENCOUNTER — Encounter: Payer: Self-pay | Admitting: Internal Medicine

## 2024-05-06 ENCOUNTER — Ambulatory Visit: Payer: Self-pay | Admitting: Internal Medicine

## 2024-05-06 VITALS — BP 118/80 | HR 64 | Temp 98.1°F | Ht 67.0 in | Wt 222.0 lb

## 2024-05-06 DIAGNOSIS — E785 Hyperlipidemia, unspecified: Secondary | ICD-10-CM | POA: Diagnosis not present

## 2024-05-06 DIAGNOSIS — Z23 Encounter for immunization: Secondary | ICD-10-CM | POA: Diagnosis not present

## 2024-05-06 DIAGNOSIS — Z0000 Encounter for general adult medical examination without abnormal findings: Secondary | ICD-10-CM

## 2024-05-06 DIAGNOSIS — E66811 Obesity, class 1: Secondary | ICD-10-CM | POA: Diagnosis not present

## 2024-05-06 DIAGNOSIS — E1169 Type 2 diabetes mellitus with other specified complication: Secondary | ICD-10-CM | POA: Insufficient documentation

## 2024-05-06 DIAGNOSIS — Z1159 Encounter for screening for other viral diseases: Secondary | ICD-10-CM

## 2024-05-06 DIAGNOSIS — A64 Unspecified sexually transmitted disease: Secondary | ICD-10-CM | POA: Diagnosis not present

## 2024-05-06 DIAGNOSIS — E6609 Other obesity due to excess calories: Secondary | ICD-10-CM | POA: Diagnosis not present

## 2024-05-06 DIAGNOSIS — I10 Essential (primary) hypertension: Secondary | ICD-10-CM

## 2024-05-06 DIAGNOSIS — Z6834 Body mass index (BMI) 34.0-34.9, adult: Secondary | ICD-10-CM | POA: Diagnosis not present

## 2024-05-06 LAB — POCT URINALYSIS DIP (CLINITEK)
Bilirubin, UA: NEGATIVE
Glucose, UA: NEGATIVE mg/dL
Ketones, POC UA: NEGATIVE mg/dL
Leukocytes, UA: NEGATIVE
Nitrite, UA: NEGATIVE
Spec Grav, UA: 1.025 (ref 1.010–1.025)
Urobilinogen, UA: 0.2 U/dL
pH, UA: 5.5 (ref 5.0–8.0)

## 2024-05-06 NOTE — Patient Instructions (Signed)

## 2024-05-06 NOTE — Progress Notes (Unsigned)
 I,Jameka J Llittleton, CMA,acting as a neurosurgeon for Martha LOISE Slocumb, MD.,have documented [name] relevant documentation on the behalf of Martha LOISE Slocumb, MD,as directed by  Martha LOISE Slocumb, MD while in the presence of Martha LOISE Slocumb, MD.  Subjective:    Patient ID: Martha [name] , female    DOB: 1971-03-26 , 53 y.o.   MRN: [id number]  Chief Complaint  Patient presents with   Annual Exam    HPI Discussed the use of AI scribe software for clinical note transcription with the patient, who gave verbal consent to proceed.  History of Present Illness    Patient presents today for annual exam, Patient reports compliance with medications. She is followed by Gyn, Dr. Henry for her pelvic exams. She denies headache, chest pain & sob.  She had labs drawn at work last week, agrees to send over the results.      Hypertension This is a chronic problem. The current episode started more than 1 year ago. The problem has been gradually improving since onset. The problem is controlled. Pertinent negatives include no blurred vision. Past treatments include calcium channel blockers. The current treatment provides moderate improvement. There are no compliance problems.   Diabetes She presents for her follow-up diabetic visit. She has type 2 diabetes mellitus. Her disease course has been stable. There are no hypoglycemic associated symptoms. Pertinent negatives for diabetes include no blurred vision. There are no hypoglycemic complications. Symptoms are stable. Risk factors for coronary artery disease include diabetes mellitus and hypertension.     Past Medical History:  Diagnosis Date   Infertility associated with anovulation      Family History  Problem Relation Age of Onset   Hypertension Father    Diabetes Father    Breast cancer Mother 95   Ovarian cancer Maternal Grandmother     Current Medications[1]   Allergies[2]    The patient states she uses {contraceptive methods:5051} for  birth control. No LMP recorded. (Menstrual status: IUD).. {Dysmenorrhea-menorrhagia:21918}. Negative for: breast discharge, breast lump(s), breast pain and breast self exam. Associated symptoms include abnormal vaginal bleeding. Pertinent negatives include abnormal bleeding (hematology), anxiety, decreased libido, depression, difficulty falling sleep, dyspareunia, history of infertility, nocturia, sexual dysfunction, sleep disturbances, urinary incontinence, urinary urgency, vaginal discharge and vaginal itching. Diet regular.The patient states her exercise level is    . The patient's tobacco use is: Tobacco Use History[3]. She has been exposed to passive smoke. The patient's alcohol use is:  Social History   Substance and Sexual Activity  Alcohol Use Yes   Alcohol/week: 2.0 standard drinks of alcohol   Types: 1 Glasses of wine, 1 Shots of liquor per week   Comment: pt drinks occasionally  . Additional information: Last pap ***, next one scheduled for ***.    Review of Systems  Constitutional: Negative.   HENT: Negative.    Eyes: Negative.  Negative for blurred vision.  Respiratory: Negative.    Cardiovascular: Negative.   Gastrointestinal: Negative.   Endocrine: Negative.   Genitourinary: Negative.   Musculoskeletal: Negative.   Skin: Negative.   Allergic/Immunologic: Negative.   Neurological: Negative.   Hematological: Negative.   Psychiatric/Behavioral: Negative.       Today's Vitals   05/06/24 1213  BP: (!) 140/100  Pulse: 73  Temp: 98.6 F (37 C)  TempSrc: Oral  Weight: 222 lb (100.7 kg)  Height: 5' 7 (1.702 m)  PainSc: 0-No pain   Body mass index is 34.77 kg/m.  Wt Readings from Last 3  Encounters:  05/06/24 222 lb (100.7 kg)  08/30/23 216 lb 9.6 oz (98.2 kg)  05/01/23 212 lb 9.6 oz (96.4 kg)     Objective:  Physical Exam      Assessment And Plan:     Encounter for general adult medical examination w/o abnormal findings -     CBC -     CMP14+EGFR -      Hemoglobin A1c -     Lipid panel -     TSH  Dyslipidemia due to type 2 diabetes mellitus (HCC)  Primary hypertension -     EKG 12-Lead  Class 1 obesity due to excess calories with serious comorbidity and body mass index (BMI) of 34.0 to 34.9 in adult  Encounter for hepatitis C screening test for low risk patient  Need for hepatitis B screening test  Immunization due -     Pneumococcal conjugate vaccine 20-valent  STD (sexually transmitted disease) -     HIV Antibody (routine testing w rflx) -     RPR W/RFLX TO RPR TITER, TREPONEMAL AB, SCREEN AND DIAGNOSIS -     HCV RNA quant rflx ultra or genotyp -     Hepatitis B surface antigen  Assessment and Plan Assessment & Plan      Return in about 4 months (around 09/04/2024) for 1 year physical,bpc. Patient was given opportunity to ask questions. Patient verbalized understanding of the plan and was able to repeat key elements of the plan. [name] questions were answered to their satisfaction.   Martha LOISE Slocumb, MD  I, Martha LOISE Slocumb, MD, have reviewed [name] documentation for this visit. The documentation on 05/06/2024 for the exam, diagnosis, procedures, and orders are [name] accurate and complete.       [1]  Current Outpatient Medications:    amLODipine  (NORVASC ) 5 MG tablet, TAKE 1 TABLET (5 MG TOTAL) BY MOUTH DAILY., Disp: 90 tablet, Rfl: 3   Cholecalciferol (VITAMIN D ) 50 MCG (2000 UT) tablet, Take 2,000 Units by mouth daily., Disp: , Rfl:    cyclobenzaprine (FLEXERIL) 10 MG tablet, Take 1 tablet by mouth every 8 (eight) hours as needed., Disp: , Rfl:    gabapentin  (NEURONTIN ) 300 MG capsule, TAKE 1 CAPSULE BY MOUTH THREE TIMES A DAY, Disp: , Rfl:    hydrochlorothiazide  (HYDRODIURIL ) 12.5 MG tablet, TAKE 1 TABLET BY MOUTH EVERY DAY, Disp: 90 tablet, Rfl: 1   levonorgestrel  (MIRENA ) 20 MCG/24HR IUD, 1 each by Intrauterine route once., Disp: , Rfl:    MAGNESIUM OXIDE PO, Take 250 mg by mouth daily at 6 (six) AM., Disp: ,  Rfl:    POTASSIUM PO, Take by mouth daily. , Disp: , Rfl:    Semaglutide , 1 MG/DOSE, 4 MG/3ML SOPN, Inject 1 mg into the skin once a week., Disp: 3 mL, Rfl: 1   Glucosamine-Chondroitin-MSM 500-200-150 MG TABS, Take by mouth. (Patient not taking: Reported on 05/06/2024), Disp: , Rfl:    Vitamin D , Ergocalciferol , (DRISDOL ) 1.25 MG (50000 UNIT) CAPS capsule, TAKE 1 CAPSULE (50,000 UNITS TOTAL) BY MOUTH EVERY 7 (SEVEN) DAYS (Patient not taking: Reported on 05/06/2024), Disp: 12 capsule, Rfl: 1 [2] No Known Allergies [3] Social History Tobacco Use  Smoking Status Never  Smokeless Tobacco Never

## 2024-05-07 LAB — CBC
Hematocrit: 43.6 % (ref 34.0–46.6)
Hemoglobin: 14.5 g/dL (ref 11.1–15.9)
MCH: 29.6 pg (ref 26.6–33.0)
MCHC: 33.3 g/dL (ref 31.5–35.7)
MCV: 89 fL (ref 79–97)
Platelets: 260 x10E3/uL (ref 150–450)
RBC: 4.9 x10E6/uL (ref 3.77–5.28)
RDW: 13.7 % (ref 11.7–15.4)
WBC: 7.7 x10E3/uL (ref 3.4–10.8)

## 2024-05-07 LAB — CMP14+EGFR
ALT: 17 IU/L (ref 0–32)
AST: 17 IU/L (ref 0–40)
Albumin: 4.4 g/dL (ref 3.8–4.9)
Alkaline Phosphatase: 120 IU/L (ref 49–135)
BUN/Creatinine Ratio: 11 (ref 9–23)
BUN: 11 mg/dL (ref 6–24)
Bilirubin Total: 0.3 mg/dL (ref 0.0–1.2)
CO2: 23 mmol/L (ref 20–29)
Calcium: 9.7 mg/dL (ref 8.7–10.2)
Chloride: 105 mmol/L (ref 96–106)
Creatinine, Ser: 1.04 mg/dL — ABNORMAL HIGH (ref 0.57–1.00)
Globulin, Total: 3.4 g/dL (ref 1.5–4.5)
Glucose: 80 mg/dL (ref 70–99)
Potassium: 4.2 mmol/L (ref 3.5–5.2)
Sodium: 140 mmol/L (ref 134–144)
Total Protein: 7.8 g/dL (ref 6.0–8.5)
eGFR: 65 mL/min/1.73 (ref >59)

## 2024-05-07 LAB — HEMOGLOBIN A1C
Est. average glucose Bld gHb Est-mCnc: 123 mg/dL
Hgb A1c MFr Bld: 5.9 % — ABNORMAL HIGH (ref 4.8–5.6)

## 2024-05-07 LAB — TSH: TSH: 1.31 u[IU]/mL (ref 0.450–4.500)

## 2024-05-07 LAB — SYPHILIS: RPR W/REFLEX TO RPR TITER AND TREPONEMAL ANTIBODIES, TRADITIONAL SCREENING AND DIAGNOSIS ALGORITHM: RPR Ser Ql: NONREACTIVE

## 2024-05-07 LAB — LIPID PANEL
Chol/HDL Ratio: 4.4 ratio (ref 0.0–4.4)
Cholesterol, Total: 208 mg/dL — ABNORMAL HIGH (ref 100–199)
HDL: 47 mg/dL (ref >39)
LDL Chol Calc (NIH): 144 mg/dL — ABNORMAL HIGH (ref 0–99)
Triglycerides: 95 mg/dL (ref 0–149)
VLDL Cholesterol Cal: 17 mg/dL (ref 5–40)

## 2024-05-07 LAB — HCV RNA QUANT RFLX ULTRA OR GENOTYP: HCV Quant Baseline: NOT DETECTED [IU]/mL

## 2024-05-07 LAB — HEPATITIS B SURFACE ANTIGEN: Hepatitis B Surface Ag: NEGATIVE

## 2024-05-07 LAB — MICROALBUMIN / CREATININE URINE RATIO
Creatinine, Urine: 234.2 mg/dL
Microalb/Creat Ratio: 24 mg/g{creat} (ref 0–29)
Microalbumin, Urine: 55.6 ug/mL

## 2024-05-07 LAB — HIV ANTIBODY (ROUTINE TESTING W REFLEX): HIV Screen 4th Generation wRfx: NONREACTIVE

## 2024-05-07 NOTE — Assessment & Plan Note (Signed)
 Chronic, fair control. Goal BP <130/80. Repeat BP improved. She will continue with amlodipine  5mg  dialy.  - Encouraged to follow low sodium diet.  - She will f/u in four months for re-evaluation.

## 2024-05-07 NOTE — Assessment & Plan Note (Addendum)
 Chronic, diabetic foot exam was performed. She will continue with semaglutide  1mg  weekly.  - Optimize protein intake - Incorporate strength training into her exercise routine.  - I DISCUSSED WITH THE PATIENT AT LENGTH REGARDING THE GOALS OF GLYCEMIC CONTROL AND POSSIBLE LONG-TERM COMPLICATIONS.  I  ALSO STRESSED THE IMPORTANCE OF COMPLIANCE WITH HOME GLUCOSE MONITORING, DIETARY RESTRICTIONS INCLUDING AVOIDANCE OF SUGARY DRINKS/PROCESSED FOODS,  ALONG WITH REGULAR EXERCISE.  I  ALSO STRESSED THE IMPORTANCE OF ANNUAL EYE EXAMS, SELF FOOT CARE AND COMPLIANCE WITH OFFICE VISITS.

## 2024-05-07 NOTE — Assessment & Plan Note (Signed)

## 2024-05-07 NOTE — Assessment & Plan Note (Signed)
 She has gained 6lbs since April 2025.  She is encouraged to aim for at least 150 minutes of exercise per week, including  strength training to decrease risk of muscle atrophy while on Glp-1 therapy. Also encouraged to optimize her protein intake.

## 2024-05-14 ENCOUNTER — Ambulatory Visit: Payer: Self-pay | Admitting: Internal Medicine

## 2024-05-29 ENCOUNTER — Other Ambulatory Visit: Payer: Self-pay | Admitting: Internal Medicine

## 2024-09-09 ENCOUNTER — Ambulatory Visit: Payer: Self-pay | Admitting: Internal Medicine

## 2025-05-12 ENCOUNTER — Encounter: Payer: Self-pay | Admitting: Internal Medicine
